# Patient Record
Sex: Female | Born: 1950 | Race: White | Hispanic: No | Marital: Married | Smoking: Never smoker
Health system: Southern US, Community
[De-identification: ages and names within clinical notes are randomized; demographics above are authoritative.]

## PROBLEM LIST (undated history)

## (undated) DIAGNOSIS — H269 Unspecified cataract: Secondary | ICD-10-CM

## (undated) DIAGNOSIS — R112 Nausea with vomiting, unspecified: Secondary | ICD-10-CM

## (undated) DIAGNOSIS — M810 Age-related osteoporosis without current pathological fracture: Secondary | ICD-10-CM

## (undated) DIAGNOSIS — K219 Gastro-esophageal reflux disease without esophagitis: Secondary | ICD-10-CM

## (undated) DIAGNOSIS — Z9889 Other specified postprocedural states: Secondary | ICD-10-CM

## (undated) DIAGNOSIS — E785 Hyperlipidemia, unspecified: Secondary | ICD-10-CM

## (undated) HISTORY — PX: HERNIA REPAIR: SHX51

## (undated) HISTORY — PX: MOUTH SURGERY: SHX715

## (undated) HISTORY — DX: Gastro-esophageal reflux disease without esophagitis: K21.9

## (undated) HISTORY — DX: Unspecified cataract: H26.9

## (undated) HISTORY — PX: EYE SURGERY: SHX253

## (undated) HISTORY — DX: Hyperlipidemia, unspecified: E78.5

## (undated) HISTORY — PX: RETINAL LASER PROCEDURE: SHX2339

---

## 2003-11-03 HISTORY — PX: COLONOSCOPY: SHX174

## 2003-12-24 LAB — HM COLONOSCOPY

## 2004-10-28 ENCOUNTER — Ambulatory Visit: Payer: Self-pay | Admitting: Family Medicine

## 2005-11-12 ENCOUNTER — Other Ambulatory Visit: Payer: Self-pay

## 2005-11-12 ENCOUNTER — Observation Stay: Payer: Self-pay | Admitting: Internal Medicine

## 2005-12-25 ENCOUNTER — Ambulatory Visit: Payer: Self-pay | Admitting: Family Medicine

## 2006-12-28 ENCOUNTER — Ambulatory Visit: Payer: Self-pay | Admitting: Family Medicine

## 2008-01-05 ENCOUNTER — Ambulatory Visit: Payer: Self-pay | Admitting: Family Medicine

## 2009-01-08 ENCOUNTER — Ambulatory Visit: Payer: Self-pay | Admitting: Family Medicine

## 2010-12-31 LAB — HM DEXA SCAN

## 2011-01-07 ENCOUNTER — Ambulatory Visit: Payer: Self-pay

## 2011-01-08 ENCOUNTER — Ambulatory Visit: Payer: Self-pay | Admitting: Family Medicine

## 2011-01-14 ENCOUNTER — Emergency Department: Payer: Self-pay | Admitting: Emergency Medicine

## 2011-01-21 ENCOUNTER — Ambulatory Visit: Payer: Self-pay | Admitting: Family Medicine

## 2012-06-09 ENCOUNTER — Ambulatory Visit: Payer: Self-pay | Admitting: Family Medicine

## 2013-07-31 LAB — HM PAP SMEAR: HM Pap smear: NEGATIVE

## 2013-08-02 ENCOUNTER — Ambulatory Visit: Payer: Self-pay | Admitting: Family Medicine

## 2014-10-15 LAB — BASIC METABOLIC PANEL
BUN: 10 mg/dL (ref 4–21)
CREATININE: 0.6 mg/dL (ref 0.5–1.1)
Glucose: 97 mg/dL
POTASSIUM: 5 mmol/L (ref 3.4–5.3)
Sodium: 144 mmol/L (ref 137–147)

## 2014-10-15 LAB — CBC AND DIFFERENTIAL
HCT: 37 % (ref 36–46)
Hemoglobin: 12.4 g/dL (ref 12.0–16.0)
Platelets: 351 10*3/uL (ref 150–399)
WBC: 6.4 10^3/mL

## 2014-10-15 LAB — LIPID PANEL
Cholesterol: 226 mg/dL — AB (ref 0–200)
HDL: 58 mg/dL (ref 35–70)
LDL CALC: 143 mg/dL
TRIGLYCERIDES: 123 mg/dL (ref 40–160)

## 2014-10-15 LAB — HEMOGLOBIN A1C: Hemoglobin A1C: 5.8

## 2014-10-15 LAB — TSH: TSH: 1.41 u[IU]/mL (ref 0.41–5.90)

## 2014-10-15 LAB — HEPATIC FUNCTION PANEL
ALT: 10 U/L (ref 7–35)
AST: 15 U/L (ref 13–35)

## 2014-11-12 ENCOUNTER — Ambulatory Visit: Payer: Self-pay | Admitting: Family Medicine

## 2014-11-12 LAB — HM MAMMOGRAPHY

## 2015-06-11 DIAGNOSIS — S233XXA Sprain of ligaments of thoracic spine, initial encounter: Secondary | ICD-10-CM | POA: Diagnosis not present

## 2015-06-11 DIAGNOSIS — S139XXA Sprain of joints and ligaments of unspecified parts of neck, initial encounter: Secondary | ICD-10-CM | POA: Diagnosis not present

## 2015-06-17 DIAGNOSIS — M546 Pain in thoracic spine: Secondary | ICD-10-CM | POA: Diagnosis not present

## 2015-06-17 DIAGNOSIS — M542 Cervicalgia: Secondary | ICD-10-CM | POA: Diagnosis not present

## 2015-06-21 DIAGNOSIS — M542 Cervicalgia: Secondary | ICD-10-CM | POA: Diagnosis not present

## 2015-06-21 DIAGNOSIS — M546 Pain in thoracic spine: Secondary | ICD-10-CM | POA: Diagnosis not present

## 2015-06-25 DIAGNOSIS — M546 Pain in thoracic spine: Secondary | ICD-10-CM | POA: Diagnosis not present

## 2015-06-25 DIAGNOSIS — M542 Cervicalgia: Secondary | ICD-10-CM | POA: Diagnosis not present

## 2015-06-27 DIAGNOSIS — M542 Cervicalgia: Secondary | ICD-10-CM | POA: Diagnosis not present

## 2015-06-27 DIAGNOSIS — M546 Pain in thoracic spine: Secondary | ICD-10-CM | POA: Diagnosis not present

## 2015-07-01 DIAGNOSIS — M546 Pain in thoracic spine: Secondary | ICD-10-CM | POA: Diagnosis not present

## 2015-07-01 DIAGNOSIS — M542 Cervicalgia: Secondary | ICD-10-CM | POA: Diagnosis not present

## 2015-07-04 DIAGNOSIS — M546 Pain in thoracic spine: Secondary | ICD-10-CM | POA: Diagnosis not present

## 2015-07-04 DIAGNOSIS — M542 Cervicalgia: Secondary | ICD-10-CM | POA: Diagnosis not present

## 2015-07-11 DIAGNOSIS — M546 Pain in thoracic spine: Secondary | ICD-10-CM | POA: Diagnosis not present

## 2015-07-11 DIAGNOSIS — M542 Cervicalgia: Secondary | ICD-10-CM | POA: Diagnosis not present

## 2015-07-18 DIAGNOSIS — M546 Pain in thoracic spine: Secondary | ICD-10-CM | POA: Diagnosis not present

## 2015-07-18 DIAGNOSIS — M542 Cervicalgia: Secondary | ICD-10-CM | POA: Diagnosis not present

## 2015-09-04 DIAGNOSIS — Z23 Encounter for immunization: Secondary | ICD-10-CM | POA: Diagnosis not present

## 2015-09-09 DIAGNOSIS — M546 Pain in thoracic spine: Secondary | ICD-10-CM | POA: Diagnosis not present

## 2015-11-13 DIAGNOSIS — H2513 Age-related nuclear cataract, bilateral: Secondary | ICD-10-CM | POA: Diagnosis not present

## 2015-11-21 DIAGNOSIS — J309 Allergic rhinitis, unspecified: Secondary | ICD-10-CM | POA: Insufficient documentation

## 2015-11-21 DIAGNOSIS — M8580 Other specified disorders of bone density and structure, unspecified site: Secondary | ICD-10-CM

## 2015-11-21 DIAGNOSIS — R739 Hyperglycemia, unspecified: Secondary | ICD-10-CM | POA: Insufficient documentation

## 2015-11-21 DIAGNOSIS — R5383 Other fatigue: Secondary | ICD-10-CM | POA: Insufficient documentation

## 2015-11-21 DIAGNOSIS — R079 Chest pain, unspecified: Secondary | ICD-10-CM | POA: Insufficient documentation

## 2015-11-21 DIAGNOSIS — R202 Paresthesia of skin: Secondary | ICD-10-CM | POA: Insufficient documentation

## 2015-11-21 DIAGNOSIS — R7303 Prediabetes: Secondary | ICD-10-CM | POA: Insufficient documentation

## 2015-11-21 DIAGNOSIS — M549 Dorsalgia, unspecified: Secondary | ICD-10-CM | POA: Insufficient documentation

## 2015-11-21 DIAGNOSIS — M810 Age-related osteoporosis without current pathological fracture: Secondary | ICD-10-CM | POA: Insufficient documentation

## 2015-11-21 DIAGNOSIS — F419 Anxiety disorder, unspecified: Secondary | ICD-10-CM | POA: Insufficient documentation

## 2015-11-22 ENCOUNTER — Encounter: Payer: Self-pay | Admitting: Family Medicine

## 2015-11-22 ENCOUNTER — Ambulatory Visit (INDEPENDENT_AMBULATORY_CARE_PROVIDER_SITE_OTHER): Payer: Medicare Other | Admitting: Family Medicine

## 2015-11-22 VITALS — BP 122/80 | HR 90 | Temp 98.1°F | Resp 16 | Ht 63.5 in | Wt 151.0 lb

## 2015-11-22 DIAGNOSIS — Z1239 Encounter for other screening for malignant neoplasm of breast: Secondary | ICD-10-CM

## 2015-11-22 DIAGNOSIS — Z0000 Encounter for general adult medical examination without abnormal findings: Secondary | ICD-10-CM | POA: Diagnosis not present

## 2015-11-22 DIAGNOSIS — Z23 Encounter for immunization: Secondary | ICD-10-CM

## 2015-11-22 DIAGNOSIS — Z780 Asymptomatic menopausal state: Secondary | ICD-10-CM | POA: Diagnosis not present

## 2015-11-22 DIAGNOSIS — E7800 Pure hypercholesterolemia, unspecified: Secondary | ICD-10-CM | POA: Diagnosis not present

## 2015-11-22 NOTE — Progress Notes (Signed)
Patient ID: Caitlyn Baker, female   DOB: [birth year], 65 y.o.   MRN: [id number]       Patient: Caitlyn Baker, Female    DOB: [birth year], 65 y.o.   MRN: [id number] Visit Date: 11/22/2015  Today's Provider: Margarita Rana, MD   Chief Complaint  Patient presents with  . Medicare Wellness   Subjective:    Annual wellness visit Caitlyn Baker is a 65 y.o. female. She feels well. She reports exercising 2 times a week (walking), stays active with daily activities. She reports she is sleeping well.  10/12/14 CPE 07/31/13 Pap-neg 11/12/14 Mammo-BI-RADS 1 12/24/03 Colon-Int hemorrhoids 12/31/10 BMD-mild osteopenia  Lab Results  Component Value Date   WBC 6.4 10/15/2014   HGB 12.4 10/15/2014   HCT 37 10/15/2014   PLT 351 10/15/2014   CHOL 226* 10/15/2014   TRIG 123 10/15/2014   HDL 58 10/15/2014   LDLCALC 143 10/15/2014   ALT 10 10/15/2014   AST 15 10/15/2014   NA 144 10/15/2014   K 5.0 10/15/2014   CREATININE 0.6 10/15/2014   BUN 10 10/15/2014   TSH 1.41 10/15/2014   HGBA1C 5.8 10/15/2014    -----------------------------------------------------------   Review of Systems  Constitutional: Negative.   HENT: Negative.   Eyes: Negative.   Respiratory: Negative.   Cardiovascular: Negative.   Gastrointestinal: Negative.   Endocrine: Negative.   Genitourinary: Negative.   Musculoskeletal: Positive for arthralgias.  Allergic/Immunologic: Negative.   Neurological: Negative.   Hematological: Negative.   Psychiatric/Behavioral: Negative.     Social History   Social History  . Marital Status: Married    Spouse Name: N/A  . Number of Children: N/A  . Years of Education: N/A   Occupational History  . Not on file.   Social History Main Topics  . Smoking status: Never Smoker   . Smokeless tobacco: Never Used  . Alcohol Use: No  . Drug Use: No  . Sexual Activity: Not on file   Other Topics Concern  . Not on file   Social History Narrative    Past Medical History    Diagnosis Date  . Allergy   . Hyperlipidemia      Patient Active Problem List   Diagnosis Date Noted  . Allergic rhinitis 11/21/2015  . Anxiety 11/21/2015  . Back ache 11/21/2015  . Chest pain 11/21/2015  . Fatigue 11/21/2015  . Blood glucose elevated 11/21/2015  . Osteopenia 11/21/2015  . Hand paresthesia 11/21/2015  . Hypercholesteremia 01/13/2007    Past Surgical History  Procedure Laterality Date  . Mouth surgery      Her family history includes Hypertension in her mother.    Previous Medications   ASPIRIN 81 MG TABLET    Take 81 mg by mouth daily.   CALCIUM CARBONATE-VITAMIN D 600-400 MG-UNIT TABLET    Take by mouth.   MULTIPLE VITAMIN PO    Take by mouth.   OMEGA-3 FATTY ACIDS (FISH OIL DOUBLE STRENGTH) 1200 MG CAPS    Take by mouth.   SIMVASTATIN (ZOCOR) 20 MG TABLET    Take by mouth. Reported on 11/22/2015   VITAMIN D, CHOLECALCIFEROL, PO    Take by mouth.    Patient Care Team: Margarita Rana, MD as PCP - General (Family Medicine)     Objective:   Vitals: BP 122/80 mmHg  Pulse 90  Temp(Src) 98.1 F (36.7 C) (Oral)  Resp 16  Ht 5' 3.5" (1.613 m)  Wt 151 lb (68.493 kg)  BMI 26.33 kg/m2  SpO2 97%  Physical Exam  Constitutional: She is oriented to person, place, and time. She appears well-developed and well-nourished.  HENT:  Head: Normocephalic and atraumatic.  Right Ear: Tympanic membrane, external ear and ear canal normal.  Left Ear: Tympanic membrane, external ear and ear canal normal.  Nose: Nose normal.  Mouth/Throat: Uvula is midline, oropharynx is clear and moist and mucous membranes are normal.  Eyes: Conjunctivae, EOM and lids are normal. Pupils are equal, round, and reactive to light.  Neck: Trachea normal and normal range of motion. Neck supple. Carotid bruit is not present. No thyroid mass and no thyromegaly present.  Cardiovascular: Normal rate, regular rhythm and normal heart sounds.   Pulmonary/Chest: Effort normal and breath sounds  normal.  Abdominal: Soft. Normal appearance and bowel sounds are normal. There is no hepatosplenomegaly. There is no tenderness.  Genitourinary: No breast swelling, tenderness or discharge.  Musculoskeletal: Normal range of motion.  Lymphadenopathy:    She has no cervical adenopathy.    She has no axillary adenopathy.  Neurological: She is alert and oriented to person, place, and time. She has normal strength. No cranial nerve deficit.  Skin: Skin is warm, dry and intact.  Psychiatric: She has a normal mood and affect. Her speech is normal and behavior is normal. Judgment and thought content normal. Cognition and memory are normal.    Activities of Daily Living In your present state of health, do you have any difficulty performing the following activities: 11/22/2015  Hearing? N  Vision? N  Difficulty concentrating or making decisions? N  Walking or climbing stairs? N  Dressing or bathing? N  Doing errands, shopping? N    Fall Risk Assessment Fall Risk  11/22/2015  Falls in the past year? No     Depression Screen PHQ 2/9 Scores 11/22/2015  PHQ - 2 Score 0    Cognitive Testing - 6-CIT  Correct? Score   What year is it? yes 0 0 or 4  What month is it? yes 0 0 or 3  Memorize:    Caitlyn Baker,  41,  [address]  [place],      What time is it? (within 1 hour) yes 0 0 or 3  Count backwards from 20 yes 0 0, 2, or 4  Name the months of the year yes 0 0, 2, or 4  Repeat name & address above yes 0 0, 2, 4, 6, 8, or 10       TOTAL SCORE  0/28   Interpretation:  Normal  Normal (0-7) Abnormal (8-28)       Assessment & Plan:     Annual Wellness Visit  Reviewed patient's Family Medical History Reviewed and updated list of patient's medical providers Assessment of cognitive impairment was done Assessed patient's functional ability Established a written schedule for health screening [place] Completed and Reviewed  Exercise Activities and Dietary  recommendations Goals    None      Immunization History  Administered Date(s) Administered  . Influenza-Unspecified 09/04/2015  . Pneumococcal Conjugate-13 11/22/2015  . Td 08/06/2004, 10/12/2014  . Tdap 10/12/2014     1. Initial Medicare annual wellness visit Stable. Patient advised to continue eating healthy and exercise daily. - EKG 12-Lead  2. Need for pneumococcal vaccination - Pneumococcal conjugate vaccine 13-valent IM  3. Breast cancer screening - MM Digital Diagnostic Bilat; Future  4. Hypercholesteremia F/U pending lab report. Patient has been off of simvastatin for a few months. - Lipid Panel  With LDL/HDL Ratio  5. Postmenopausal - DG Bone Density; Future     Patient seen and examined by Dr. Jerrell Belfast, and note scribed by Philbert Riser. Dimas, CMA. I have reviewed the document for accuracy and completeness and I agree with above. Jerrell Belfast, MD   Margarita Rana, MD    ------------------------------------------------------------------------------------------------------------

## 2015-11-22 NOTE — Patient Instructions (Signed)
Please call the [facility] at [facility] to schedule this at [phone]   

## 2015-11-28 ENCOUNTER — Other Ambulatory Visit: Payer: Self-pay | Admitting: Family Medicine

## 2015-11-28 DIAGNOSIS — E7800 Pure hypercholesterolemia, unspecified: Secondary | ICD-10-CM | POA: Diagnosis not present

## 2015-11-28 DIAGNOSIS — Z1231 Encounter for screening mammogram for malignant neoplasm of breast: Secondary | ICD-10-CM

## 2015-11-29 LAB — LIPID PANEL WITH LDL/HDL RATIO
CHOLESTEROL TOTAL: 245 mg/dL — AB (ref 100–199)
HDL: 61 mg/dL (ref >39)
LDL CALC: 154 mg/dL — AB (ref 0–99)
LDl/HDL Ratio: 2.5 ratio units (ref 0.0–3.2)
TRIGLYCERIDES: 150 mg/dL — AB (ref 0–149)
VLDL CHOLESTEROL CAL: 30 mg/dL (ref 5–40)

## 2015-12-02 ENCOUNTER — Other Ambulatory Visit: Payer: Self-pay

## 2015-12-02 DIAGNOSIS — E7800 Pure hypercholesterolemia, unspecified: Secondary | ICD-10-CM

## 2015-12-02 MED ORDER — SIMVASTATIN 20 MG PO TABS: 20.0000 mg | ORAL_TABLET | Freq: Every day | ORAL | Status: DC

## 2015-12-02 NOTE — Telephone Encounter (Signed)
-----   Message from Margarita Rana, MD sent at 11/29/2015  4:02 PM EST ----- Ok to send in refill. Thanks.

## 2015-12-12 ENCOUNTER — Telehealth: Payer: Self-pay

## 2015-12-12 ENCOUNTER — Ambulatory Visit
Admission: RE | Admit: 2015-12-12 | Discharge: 2015-12-12 | Disposition: A | Discharge status: Home / Self Care | Payer: Medicare Other | Source: Ambulatory Visit | Attending: Family Medicine | Admitting: Family Medicine

## 2015-12-12 ENCOUNTER — Other Ambulatory Visit: Payer: Self-pay | Admitting: Family Medicine

## 2015-12-12 DIAGNOSIS — M8588 Other specified disorders of bone density and structure, other site: Secondary | ICD-10-CM | POA: Diagnosis not present

## 2015-12-12 DIAGNOSIS — Z1231 Encounter for screening mammogram for malignant neoplasm of breast: Secondary | ICD-10-CM | POA: Insufficient documentation

## 2015-12-12 DIAGNOSIS — Z780 Asymptomatic menopausal state: Secondary | ICD-10-CM | POA: Diagnosis not present

## 2015-12-12 DIAGNOSIS — Z13820 Encounter for screening for osteoporosis: Secondary | ICD-10-CM | POA: Diagnosis not present

## 2015-12-12 DIAGNOSIS — M85852 Other specified disorders of bone density and structure, left thigh: Secondary | ICD-10-CM | POA: Diagnosis not present

## 2015-12-12 DIAGNOSIS — M8589 Other specified disorders of bone density and structure, multiple sites: Secondary | ICD-10-CM | POA: Diagnosis not present

## 2015-12-12 LAB — HM MAMMOGRAPHY

## 2015-12-12 NOTE — Telephone Encounter (Signed)
Informed pt as below. Pt verbalizes understanding. Jamonica Schoff Drozdowski, CMA  

## 2015-12-12 NOTE — Telephone Encounter (Signed)
-----   Message from Margarita Rana, MD sent at 12/12/2015  2:42 PM EST ----- Bone density with osteopenia. Not osteoporosis. Recheck in   2 years. Thanks.

## 2016-05-08 ENCOUNTER — Other Ambulatory Visit: Payer: Self-pay | Admitting: *Deleted

## 2016-05-08 MED ORDER — SIMVASTATIN 20 MG PO TABS: 20.0000 mg | ORAL_TABLET | Freq: Every day | ORAL | Status: DC

## 2016-05-08 NOTE — Telephone Encounter (Signed)
Patient has ov appt with Dr. Caryn Section 05/21/2016.

## 2016-05-21 ENCOUNTER — Encounter: Payer: Self-pay | Admitting: Family Medicine

## 2016-05-21 ENCOUNTER — Ambulatory Visit (INDEPENDENT_AMBULATORY_CARE_PROVIDER_SITE_OTHER): Payer: Medicare Other | Admitting: Family Medicine

## 2016-05-21 ENCOUNTER — Ambulatory Visit: Payer: Medicare Other | Admitting: Family Medicine

## 2016-05-21 VITALS — BP 140/80 | HR 97 | Temp 98.2°F | Resp 16 | Wt 133.0 lb

## 2016-05-21 DIAGNOSIS — Z1211 Encounter for screening for malignant neoplasm of colon: Secondary | ICD-10-CM

## 2016-05-21 DIAGNOSIS — E7800 Pure hypercholesterolemia, unspecified: Secondary | ICD-10-CM | POA: Diagnosis not present

## 2016-05-21 NOTE — Progress Notes (Signed)
Patient: Caitlyn Baker Female    DOB: [birth year]   65 y.o.   MRN: [id number] Visit Date: 05/21/2016  Today's Provider: Lelon Huh, MD   Chief Complaint  Patient presents with  . Follow-up  . Hyperlipidemia   Subjective:    HPI  This is a previous patient of Dr. Venia Minks present today as new patient to me to establish care and follow up on chronic medical problems.     Lipid/Cholesterol, Follow-up:   Last seen for this 6 months ago.  Management since that visit includes; labs checked-showed cholesterol still elevated at 245. Patient was not taking simvastatin as prescribed. Patient given refill and advised to recheck labs.   Last Lipid Panel:    Component Value Date/Time   CHOL 245* 11/28/2015 0814   CHOL 226* 10/15/2014   TRIG 150* 11/28/2015 0814   HDL 61 11/28/2015 0814   HDL 58 10/15/2014   LDLCALC 154* 11/28/2015 0814   LDLCALC 143 10/15/2014    She reports non-compliance with treatment due to side effects of medications which include extreme fatigue and weakness in her legs. She has since gone on low saturated fat and low sugar diet and feels really good  Her mother did die of heart attack in her mid 98s and she has one brother that has heart disease.    Wt Readings from Last 3 Encounters:  05/21/16 133 lb (60.328 kg)  11/22/15 151 lb (68.493 kg)  10/12/14 148 lb (67.132 kg)    ----------------------------------------------------------------------  Patient stated that she started back on Simvastatin, however because of leg pain she stopped taking it. Patient has not been on cholesterol medication since January.     Allergies  Allergen Reactions  . Codeine   . Prednisone     Other reaction(s): Chest pain Severe anxiety and depression.   Current Meds  Medication Sig  . aspirin 81 MG tablet Take 81 mg by mouth daily.  . Calcium Carbonate-Vitamin D 600-400 MG-UNIT tablet Take by mouth.  . MULTIPLE VITAMIN PO Take by mouth.  . Omega-3 Fatty  Acids (FISH OIL DOUBLE STRENGTH) 1200 MG CAPS Take by mouth.    Review of Systems  Constitutional: Negative for fever, chills, appetite change and fatigue.  Respiratory: Negative for chest tightness and shortness of breath.   Cardiovascular: Negative for chest pain and palpitations.  Gastrointestinal: Negative for nausea, vomiting and abdominal pain.  Neurological: Negative for dizziness and weakness.    Social History  Substance Use Topics  . Smoking status: Never Smoker   . Smokeless tobacco: Never Used  . Alcohol Use: No   Objective:   BP 140/80 mmHg  Pulse 97  Temp(Src) 98.2 F (36.8 C) (Oral)  Resp 16  Wt 133 lb (60.328 kg)  SpO2 97%  Physical Exam  General appearance: alert, well developed, well nourished, cooperative and in no distress Head: Normocephalic, without obvious abnormality, atraumatic Respiratory: Respirations even and unlabored, normal respiratory rate Extremities: No gross deformities Skin: Skin color, texture, turgor normal. No rashes seen  Psych: Appropriate mood and affect. Neurologic: Mental status: Alert, oriented to person, place, and time, thought content appropriate.     Assessment & Plan:     1. Hypercholesteremia Intolerant to simvastatin. On low saturated fat diet. Consider trial of CoQ10 with a different statin if LDL remains very high.  - Lipid panel - ALT  2. Colon cancer screening She had to cancel colonoscopy last year to emergency, but would rather have stool  test.  - Cologuard  The entirety of the information documented in the History of Present Illness, Review of Systems and Physical Exam were personally obtained by me. Portions of this information were initially documented by April M. Sabra Heck, CMA and reviewed by me for thoroughness and accuracy.        Lelon Huh, MD  [facility]

## 2016-05-22 DIAGNOSIS — E7800 Pure hypercholesterolemia, unspecified: Secondary | ICD-10-CM | POA: Diagnosis not present

## 2016-05-23 LAB — LIPID PANEL
CHOLESTEROL TOTAL: 244 mg/dL — AB (ref 100–199)
Chol/HDL Ratio: 3.9 ratio units (ref 0.0–4.4)
HDL: 63 mg/dL (ref >39)
LDL Calculated: 147 mg/dL — ABNORMAL HIGH (ref 0–99)
Triglycerides: 168 mg/dL — ABNORMAL HIGH (ref 0–149)
VLDL CHOLESTEROL CAL: 34 mg/dL (ref 5–40)

## 2016-05-23 LAB — ALT: ALT: 11 IU/L (ref 0–32)

## 2016-05-26 ENCOUNTER — Telehealth: Payer: Self-pay

## 2016-05-26 DIAGNOSIS — E7800 Pure hypercholesterolemia, unspecified: Secondary | ICD-10-CM

## 2016-05-26 MED ORDER — PRAVASTATIN SODIUM 20 MG PO TABS: 20.0000 mg | ORAL_TABLET | Freq: Every day | ORAL | 3 refills | Status: DC

## 2016-05-26 NOTE — Telephone Encounter (Signed)
-----   Message from Birdie Sons, MD sent at 05/24/2016  8:55 AM EDT ----- Cholesterol is still too high. Recommend trial of pravastatin 20mg  daily, #30, rf x 3 and OTC Coenzyme Q10 200mg  daily. Follow up in 3 months.

## 2016-05-26 NOTE — Telephone Encounter (Signed)
Patient advised of lab results and agrees to start medication. Prescription for Pravastatin sent into pharmacy.Follow up appointment scheduled 08/27/2016 at 8:15am.

## 2016-08-03 DIAGNOSIS — H43812 Vitreous degeneration, left eye: Secondary | ICD-10-CM | POA: Diagnosis not present

## 2016-08-07 DIAGNOSIS — Z23 Encounter for immunization: Secondary | ICD-10-CM | POA: Diagnosis not present

## 2016-08-10 DIAGNOSIS — H43811 Vitreous degeneration, right eye: Secondary | ICD-10-CM | POA: Diagnosis not present

## 2016-08-18 IMAGING — MG MM DIGITAL SCREENING BILAT W/ TOMO W/ CAD
9 of 13 series · 9 of 29 positions shown · non-contrast
Comparison: Previous exam(s).

CLINICAL DATA: Screening.

EXAM:
DIGITAL SCREENING BILATERAL MAMMOGRAM WITH 3D TOMO WITH CAD

[R MLO (1 of 2)]
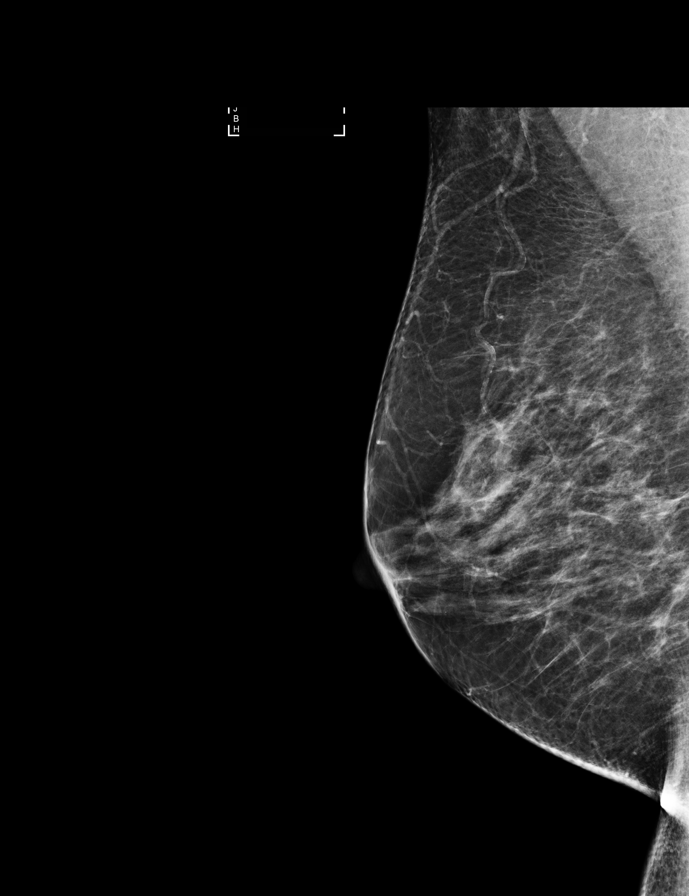

[R MLO synth-2D]
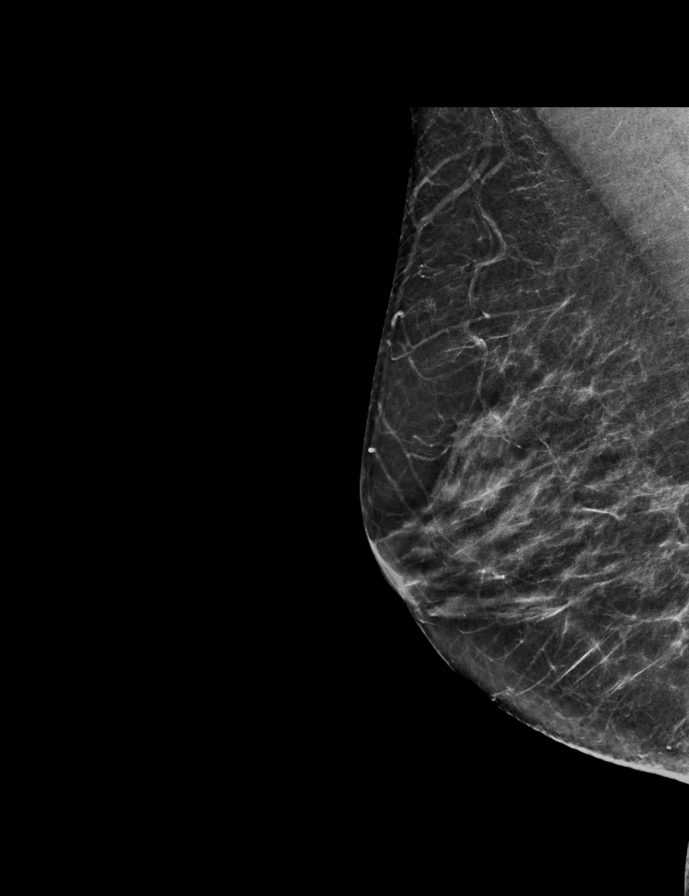

[L MLO]
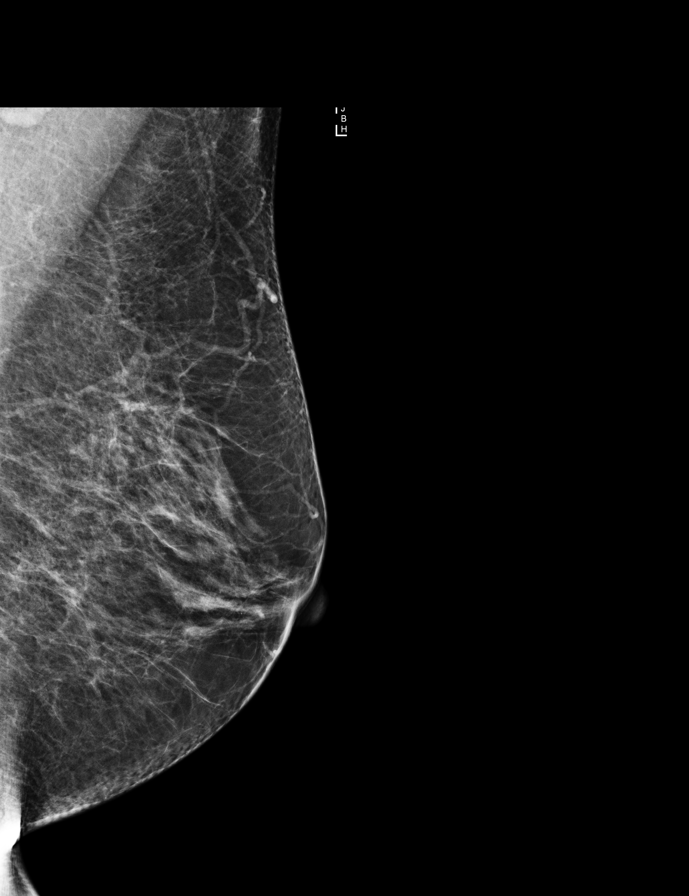

[L CC synth-2D]
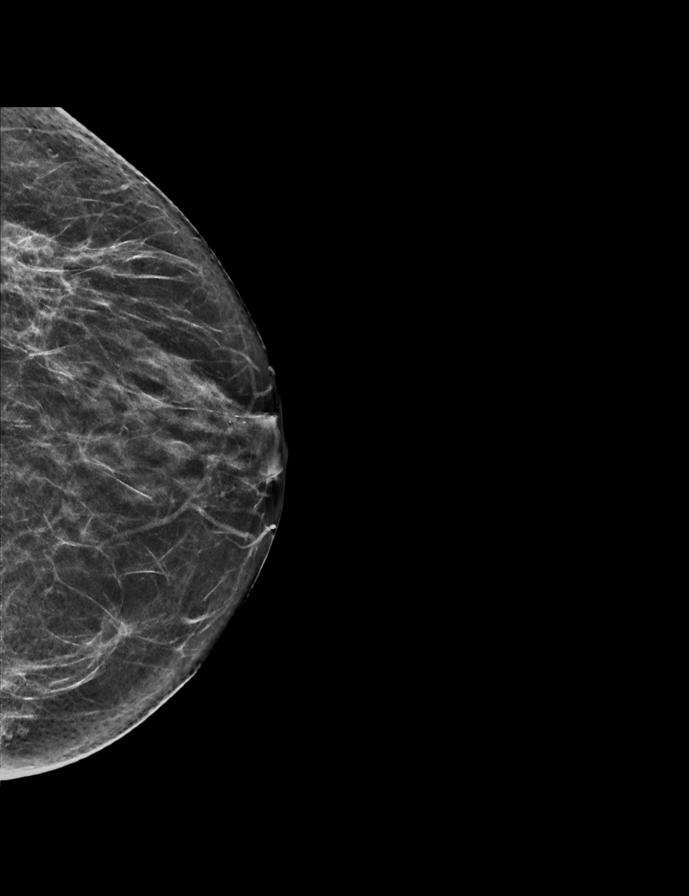

[L MLO synth-2D]
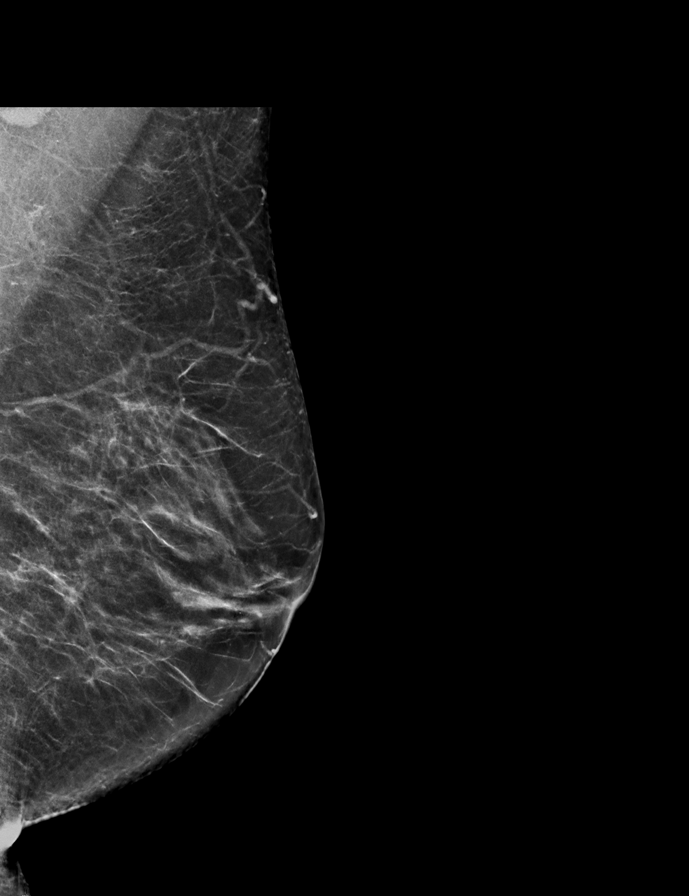

[R MLO (2 of 2)]
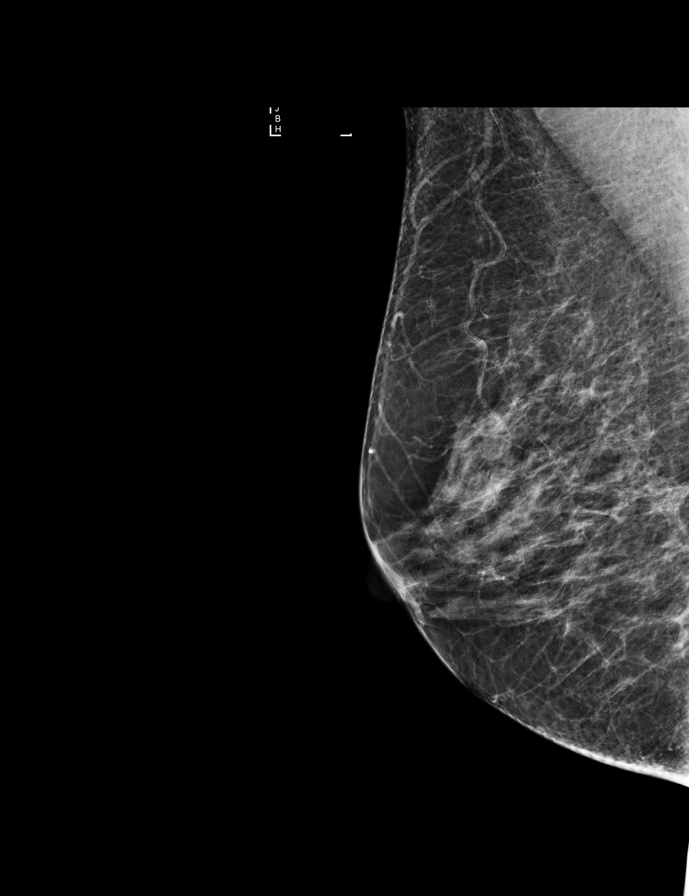

[L CC]
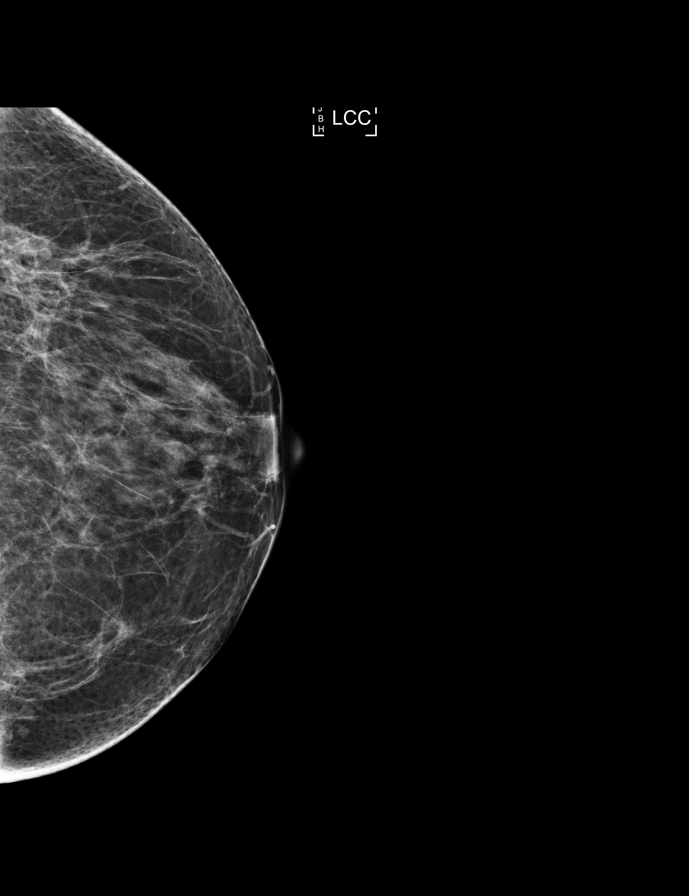

[R CC]
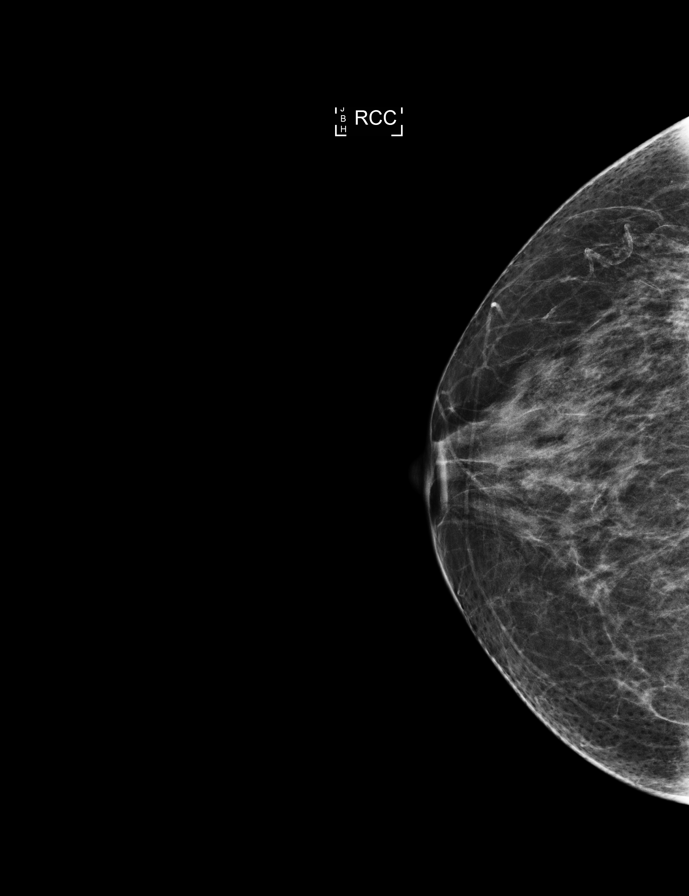

[R CC synth-2D]
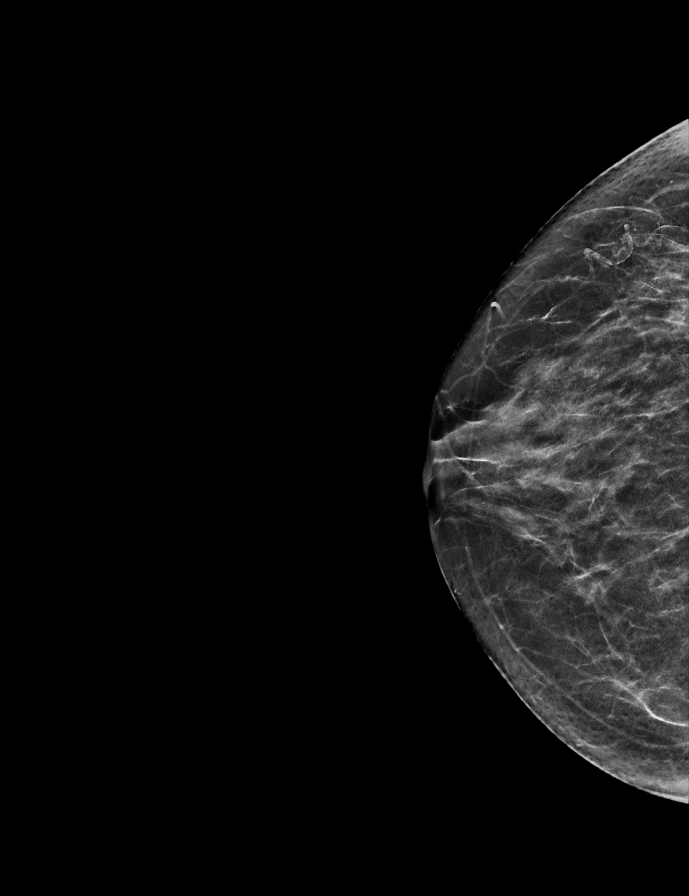

[9 of 29 positions shown; findings below may reference images not displayed]

ACR Breast Density Category c: The breast tissue is heterogeneously
dense, which may obscure small masses.
FINDINGS: There are no findings suspicious for malignancy. Images were
processed with CAD.
IMPRESSION: No mammographic evidence of malignancy. A result letter of this
screening mammogram will be mailed directly to the patient.

RECOMMENDATION:
Screening mammogram in one year. (Code:[id number]-[id number])

BI-RADS CATEGORY  1: Negative.

## 2016-08-27 ENCOUNTER — Ambulatory Visit (INDEPENDENT_AMBULATORY_CARE_PROVIDER_SITE_OTHER): Payer: Medicare Other | Admitting: Family Medicine

## 2016-08-27 ENCOUNTER — Encounter: Payer: Self-pay | Admitting: Family Medicine

## 2016-08-27 VITALS — BP 122/72 | HR 68 | Temp 97.8°F | Resp 16 | Wt 134.0 lb

## 2016-08-27 DIAGNOSIS — E7800 Pure hypercholesterolemia, unspecified: Secondary | ICD-10-CM

## 2016-08-27 NOTE — Progress Notes (Signed)
       Patient: Caitlyn Baker Female    DOB: [birth year]   65 y.o.   MRN: [id number] Visit Date: 08/27/2016  Today's Provider: Lelon Huh, MD   Chief Complaint  Patient presents with  . Hyperlipidemia   Subjective:    HPI  Lipid/Cholesterol, Follow-up:   Last seen for this3 months ago.  Management changes since that visit include starting Pravastatin 20mg  and CoQ10 once daily. . Last Lipid Panel:    Component Value Date/Time   CHOL 244 (H) 05/22/2016 0816   TRIG 168 (H) 05/22/2016 0816   HDL 63 05/22/2016 0816   CHOLHDL 3.9 05/22/2016 0816   LDLCALC 147 (H) 05/22/2016 0816    She reports good compliance with treatment. She is not having side effects.  Current symptoms include none and have been stable. Weight trend: stable Prior visit with dietician: no Current diet: well balanced Current exercise: walking  Wt Readings from Last 3 Encounters:  08/27/16 134 lb (60.8 kg)  05/21/16 133 lb (60.3 kg)  11/22/15 151 lb (68.5 kg)         Allergies  Allergen Reactions  . Codeine   . Prednisone     Other reaction(s): Chest pain Severe anxiety and depression.     Current Outpatient Prescriptions:  .  aspirin 81 MG tablet, Take 81 mg by mouth daily., Disp: , Rfl:  .  Calcium Carbonate-Vitamin D 600-400 MG-UNIT tablet, Take by mouth., Disp: , Rfl:  .  MULTIPLE VITAMIN PO, Take by mouth., Disp: , Rfl:  .  Omega-3 Fatty Acids (FISH OIL DOUBLE STRENGTH) 1200 MG CAPS, Take by mouth., Disp: , Rfl:  .  pravastatin (PRAVACHOL) 20 MG tablet, Take 1 tablet (20 mg total) by mouth daily., Disp: 30 tablet, Rfl: 3  Review of Systems  Constitutional: Negative.   Respiratory: Negative.   Cardiovascular: Negative.   Gastrointestinal: Negative.   Musculoskeletal: Negative.   Neurological: Negative.     Social History  Substance Use Topics  . Smoking status: Never Smoker  . Smokeless tobacco: Never Used  . Alcohol use No   Objective:   BP 122/72 (BP Location: Left  Arm, Patient Position: Sitting, Cuff Size: Normal)   Pulse 68   Temp 97.8 F (36.6 C)   Resp 16   Wt 134 lb (60.8 kg)   BMI 23.36 kg/m   Physical Exam  General appearance: alert, well developed, well nourished, cooperative and in no distress Head: Normocephalic, without obvious abnormality, atraumatic Respiratory: Respirations even and unlabored, normal respiratory rate Extremities: No gross deformities Skin: Skin color, texture, turgor normal. No rashes seen  Psych: Appropriate mood and affect. Neurologic: Mental status: Alert, oriented to person, place, and time, thought content appropriate.     Assessment & Plan:     1. Hypercholesterolemia She is tolerating initiation of pravastatin with CoQ10 well with no adverse effects.   - Lipid panel - Hepatic function panel  She states she had flu vaccine at     The entirety of the information documented in the History of Present Illness, Review of Systems and Physical Exam were personally obtained by me. Portions of this information were initially documented by Wilburt Finlay, CMA and reviewed by me for thoroughness and accuracy.    Lelon Huh, MD  [facility]

## 2016-08-28 ENCOUNTER — Telehealth: Payer: Self-pay

## 2016-08-28 DIAGNOSIS — E7800 Pure hypercholesterolemia, unspecified: Secondary | ICD-10-CM

## 2016-08-28 LAB — LIPID PANEL
CHOL/HDL RATIO: 3 ratio (ref 0.0–4.4)
Cholesterol, Total: 200 mg/dL — ABNORMAL HIGH (ref 100–199)
HDL: 66 mg/dL (ref >39)
LDL CALC: 113 mg/dL — AB (ref 0–99)
Triglycerides: 105 mg/dL (ref 0–149)
VLDL CHOLESTEROL CAL: 21 mg/dL (ref 5–40)

## 2016-08-28 LAB — HEPATIC FUNCTION PANEL
ALT: 13 IU/L (ref 0–32)
AST: 21 IU/L (ref 0–40)
Albumin: 4.4 g/dL (ref 3.6–4.8)
Alkaline Phosphatase: 68 IU/L (ref 39–117)
Bilirubin Total: 0.5 mg/dL (ref 0.0–1.2)
Bilirubin, Direct: 0.13 mg/dL (ref 0.00–0.40)
Total Protein: 7 g/dL (ref 6.0–8.5)

## 2016-08-28 MED ORDER — PRAVASTATIN SODIUM 20 MG PO TABS: 20.0000 mg | ORAL_TABLET | Freq: Every day | ORAL | 11 refills | Status: DC

## 2016-08-28 NOTE — Telephone Encounter (Signed)
Patient advised and verbally voiced understanding. Prescription sent into the pharmacy.  

## 2016-08-28 NOTE — Telephone Encounter (Signed)
-----   Message from Birdie Sons, MD sent at 08/28/2016  7:53 AM EDT ----- Cholesterol significantly better, down from 244 to 200. Would like to see around 180. Continue current dose of pravastatin for now and avoid saturated fats (mainly red meats) can send 1 years refill to her pharmacy.

## 2016-09-18 DIAGNOSIS — H43812 Vitreous degeneration, left eye: Secondary | ICD-10-CM | POA: Diagnosis not present

## 2017-01-25 DIAGNOSIS — H43812 Vitreous degeneration, left eye: Secondary | ICD-10-CM | POA: Diagnosis not present

## 2017-03-03 ENCOUNTER — Encounter: Payer: Self-pay | Admitting: Family Medicine

## 2017-03-03 ENCOUNTER — Ambulatory Visit (INDEPENDENT_AMBULATORY_CARE_PROVIDER_SITE_OTHER): Payer: Medicare Other | Admitting: Family Medicine

## 2017-03-03 VITALS — BP 120/70 | HR 88 | Temp 98.2°F | Resp 16 | Ht 64.0 in | Wt 141.0 lb

## 2017-03-03 DIAGNOSIS — E7800 Pure hypercholesterolemia, unspecified: Secondary | ICD-10-CM | POA: Diagnosis not present

## 2017-03-03 DIAGNOSIS — Z1211 Encounter for screening for malignant neoplasm of colon: Secondary | ICD-10-CM

## 2017-03-03 DIAGNOSIS — M791 Myalgia, unspecified site: Secondary | ICD-10-CM

## 2017-03-03 DIAGNOSIS — M8580 Other specified disorders of bone density and structure, unspecified site: Secondary | ICD-10-CM

## 2017-03-03 DIAGNOSIS — Z0000 Encounter for general adult medical examination without abnormal findings: Secondary | ICD-10-CM | POA: Diagnosis not present

## 2017-03-03 DIAGNOSIS — Z23 Encounter for immunization: Secondary | ICD-10-CM | POA: Diagnosis not present

## 2017-03-03 DIAGNOSIS — R5383 Other fatigue: Secondary | ICD-10-CM | POA: Diagnosis not present

## 2017-03-03 NOTE — Patient Instructions (Signed)
Preventive Care 67 Years and Older, Female Preventive care refers to lifestyle choices and visits with your health care provider that can promote health and wellness. What does preventive care include?  A yearly physical exam. This is also called an annual well check.  Dental exams once or twice a year.  Routine eye exams. Ask your health care provider how often you should have your eyes checked.  Personal lifestyle choices, including:  Daily care of your teeth and gums.  Regular physical activity.  Eating a healthy diet.  Avoiding tobacco and drug use.  Limiting alcohol use.  Practicing safe sex.  Taking low-dose aspirin every day.  Taking vitamin and mineral supplements as recommended by your health care provider. What happens during an annual well check? The services and screenings done by your health care provider during your annual well check will depend on your age, overall health, lifestyle risk factors, and family history of disease. Counseling  Your health care provider may ask you questions about your:  Alcohol use.  Tobacco use.  Drug use.  Emotional well-being.  Home and relationship well-being.  Sexual activity.  Eating habits.  History of falls.  Memory and ability to understand (cognition).  Work and work environment.  Reproductive health. Screening  You may have the following tests or measurements:  Height, weight, and BMI.  Blood pressure.  Lipid and cholesterol levels. These may be checked every 5 years, or more frequently if you are over 50 years old.  Skin check.  Lung cancer screening. You may have this screening every year starting at age 55 if you have a 30-pack-year history of smoking and currently smoke or have quit within the past 15 years.  Fecal occult blood test (FOBT) of the stool. You may have this test every year starting at age 50.  Flexible sigmoidoscopy or colonoscopy. You may have a sigmoidoscopy every 5 years or  a colonoscopy every 10 years starting at age 50.  Hepatitis C blood test.  Hepatitis B blood test.  Sexually transmitted disease (STD) testing.  Diabetes screening. This is done by checking your blood sugar (glucose) after you have not eaten for a while (fasting). You may have this done every 1-3 years.  Bone density scan. This is done to screen for osteoporosis. You may have this done starting at age 67.  Mammogram. This may be done every 1-2 years. Talk to your health care provider about how often you should have regular mammograms. Talk with your health care provider about your test results, treatment options, and if necessary, the need for more tests. Vaccines  Your health care provider may recommend certain vaccines, such as:  Influenza vaccine. This is recommended every year.  Tetanus, diphtheria, and acellular pertussis (Tdap, Td) vaccine. You may need a Td booster every 10 years.  Varicella vaccine. You may need this if you have not been vaccinated.  Zoster vaccine. You may need this after age 60.  Measles, mumps, and rubella (MMR) vaccine. You may need at least one dose of MMR if you were born in 1957 or later. You may also need a second dose.  Pneumococcal 13-valent conjugate (PCV13) vaccine. One dose is recommended after age 67.  Pneumococcal polysaccharide (PPSV23) vaccine. One dose is recommended after age 67.  Meningococcal vaccine. You may need this if you have certain conditions.  Hepatitis A vaccine. You may need this if you have certain conditions or if you travel or work in places where you may be exposed to   hepatitis A.  Hepatitis B vaccine. You may need this if you have certain conditions or if you travel or work in places where you may be exposed to hepatitis B.  Haemophilus influenzae type b (Hib) vaccine. You may need this if you have certain conditions. Talk to your health care provider about which screenings and vaccines you need and how often you need  them. This information is not intended to replace advice given to you by your health care provider. Make sure you discuss any questions you have with your health care provider. Document Released: 11/15/2015 Document Revised: 07/08/2016 Document Reviewed: 08/20/2015 Elsevier Interactive Patient Education  2017 [employer]  

## 2017-03-03 NOTE — Progress Notes (Signed)
Patient: Caitlyn Baker, Female    DOB: 10-22-1951, 66 y.o.   MRN: [id number] Visit Date: 03/03/2017  Today's Provider: Lelon Huh, MD   Chief Complaint  Patient presents with  . Medicare Wellness  . Hyperlipidemia   Subjective:    Annual wellness visit Caitlyn Baker is a 66 y.o. female. She feels well. She reports exercising some. She reports she is sleeping fairly well.  -----------------------------------------------------------     Lipid/Cholesterol, Follow-up:   Last seen for this 7 months ago.  Management since that visit includes; labs checked. Advised to avoid saturated fats.  Last Lipid Panel:    Component Value Date/Time   CHOL 200 (H) 08/27/2016 0900   TRIG 105 08/27/2016 0900   HDL 66 08/27/2016 0900   CHOLHDL 3.0 08/27/2016 0900   LDLCALC 113 (H) 08/27/2016 0900    She reports good compliance with treatment. She is not having side effects. none  Wt Readings from Last 3 Encounters:  03/03/17 141 lb (64 kg)  08/27/16 134 lb (60.8 kg)  05/21/16 133 lb (60.3 kg)    ----------------------------------------------------------------  She states she has been more fatigued than usual the last couple of months. Is not sleeping well due generalized muscle aches. She states she was already having muscle aches before starting statin as above, but they have been worsening over time. She is already taking 2 CoQ10 daily  Review of Systems  Constitutional: Positive for fatigue. Negative for chills and fever.  HENT: Negative for congestion, ear pain, rhinorrhea, sneezing and sore throat.   Eyes: Negative.  Negative for pain and redness.  Respiratory: Negative for cough, shortness of breath and wheezing.   Cardiovascular: Negative for chest pain and leg swelling.  Gastrointestinal: Negative for abdominal pain, blood in stool, constipation, diarrhea and nausea.  Endocrine: Negative for polydipsia and polyphagia.  Genitourinary: Negative.  Negative for dysuria,  flank pain, hematuria, pelvic pain, vaginal bleeding and vaginal discharge.  Musculoskeletal: Positive for myalgias. Negative for arthralgias, back pain, gait problem and joint swelling.  Skin: Negative for rash.  Neurological: Negative.  Negative for dizziness, tremors, seizures, weakness, light-headedness, numbness and headaches.  Hematological: Negative for adenopathy.  Psychiatric/Behavioral: Negative.  Negative for behavioral problems, confusion and dysphoric mood. The patient is not nervous/anxious and is not hyperactive.   All other systems reviewed and are negative.   Social History   Social History  . Marital status: Married    Spouse name: N/A  . Number of children: N/A  . Years of education: N/A   Occupational History  . Not on file.   Social History Main Topics  . Smoking status: Never Smoker  . Smokeless tobacco: Never Used  . Alcohol use No  . Drug use: No  . Sexual activity: Not on file   Other Topics Concern  . Not on file   Social History Narrative  . No narrative on file    History reviewed. No pertinent past medical history.   Patient Active Problem List   Diagnosis Date Noted  . Allergic rhinitis 11/21/2015  . Anxiety 11/21/2015  . Back ache 11/21/2015  . Chest pain 11/21/2015  . Fatigue 11/21/2015  . Blood glucose elevated 11/21/2015  . Osteopenia 11/21/2015  . Hand paresthesia 11/21/2015  . Hypercholesteremia 01/13/2007    Past Surgical History:  Procedure Laterality Date  . MOUTH SURGERY      Her family history includes Hypertension in her mother.      Current Outpatient Prescriptions:  .  aspirin 81 MG tablet, Take 81 mg by mouth daily., Disp: , Rfl:  .  Calcium Carbonate-Vitamin D 600-400 MG-UNIT tablet, Take by mouth., Disp: , Rfl:  .  Coenzyme Q10 (COQ10 PO), Take by mouth., Disp: , Rfl:  .  MULTIPLE VITAMIN PO, Take by mouth., Disp: , Rfl:  .  pravastatin (PRAVACHOL) 20 MG tablet, Take 1 tablet (20 mg total) by mouth daily.,  Disp: 30 tablet, Rfl: 11  Patient Care Team: Birdie Sons, MD as PCP - General (Family Medicine)     Objective:   Vitals: BP 120/70 (BP Location: Right Arm, Patient Position: Sitting, Cuff Size: Normal)   Pulse 88   Temp 98.2 F (36.8 C) (Oral)   Resp 16   Ht '5\' 4"'  (1.626 m)   Wt 141 lb (64 kg)   SpO2 98%   BMI 24.20 kg/m   Physical Exam   General Appearance:    Alert, cooperative, no distress, appears stated age  Head:    Normocephalic, without obvious abnormality, atraumatic  Eyes:    PERRL, conjunctiva/corneas clear, EOM's intact, fundi    benign, both eyes  Ears:    Normal TM's and external ear canals, both ears  Nose:   Nares normal, septum midline, mucosa normal, no drainage    or sinus tenderness  Throat:   Lips, mucosa, and tongue normal; teeth and gums normal  Neck:   Supple, symmetrical, trachea midline, no adenopathy;    thyroid:  no enlargement/tenderness/nodules; no carotid   bruit or JVD  Back:     Symmetric, no curvature, ROM normal, no CVA tenderness  Lungs:     Clear to auscultation bilaterally, respirations unlabored  Chest Wall:    No tenderness or deformity   Heart:    Regular rate and rhythm, S1 and S2 normal, no murmur, rub   or gallop  Breast Exam:    normal appearance, no masses or tenderness  Abdomen:     Soft, non-tender, bowel sounds active all four quadrants,    no masses, no organomegaly  Pelvic:    deferred  Extremities:   Extremities normal, atraumatic, no cyanosis or edema  Pulses:   2+ and symmetric all extremities  Skin:   Skin color, texture, turgor normal, no rashes or lesions  Lymph nodes:   Cervical, supraclavicular, and axillary nodes normal  Neurologic:   CNII-XII intact, normal strength, sensation and reflexes    throughout    Activities of Daily Living In your present state of health, do you have any difficulty performing the following activities: 03/03/2017  Hearing? N  Vision? N  Difficulty concentrating or making  decisions? N  Walking or climbing stairs? N  Dressing or bathing? N  Doing errands, shopping? N  Some recent data might be hidden    Fall Risk Assessment Fall Risk  03/03/2017 11/22/2015  Falls in the past year? No No     Depression Screen PHQ 2/9 Scores 03/03/2017 11/22/2015  PHQ - 2 Score 0 0  PHQ- 9 Score 4 -    Cognitive Testing - 6-CIT  Correct? Score   What year is it? yes 0 0 or 4  What month is it? yes 0 0 or 3  Memorize:    Pia Mau,  41,  [place],      What time is it? (within 1 hour) yes 0 0 or 3  Count backwards from 20 yes 0 0, 2, or 4  Name the months of the  year yes 0 0, 2, or 4  Repeat name & address above yes 0 0, 2, 4, 6, 8, or 10       TOTAL SCORE  0/28   Interpretation:  Normal  Normal (0-7) Abnormal (8-28)    Audit-C Alcohol Use Screening  Question Answer Points  How often do you have alcoholic drink? never 0  On days you do drink alcohol, how many drinks do you typically consume? n/a 0  How oftey will you drink 6 or more in a total? never 0  Total Score:  0   A score of 3 or more in women, and 4 or more in men indicates increased risk for alcohol abuse, EXCEPT if all of the points are from question 1.     Assessment & Plan:     Annual Wellness Visit  Reviewed patient's Family Medical History Reviewed and updated list of patient's medical providers Assessment of cognitive impairment was done Assessed patient's functional ability Established a written schedule for health screening [place] Completed and Reviewed  Exercise Activities and Dietary recommendations Goals    None      Immunization History  Administered Date(s) Administered  . Influenza-Unspecified 09/04/2015  . Pneumococcal Conjugate-13 11/22/2015  . Td 08/06/2004, 10/12/2014  . Tdap 10/12/2014    Health Maintenance  Topic Date Due  . COLONOSCOPY  12/23/2013  . PNA vac Low Risk Adult (2 of 2 - PPSV23) 11/21/2016  . INFLUENZA  VACCINE  06/02/2017  . MAMMOGRAM  12/11/2017  . DEXA SCAN  12/11/2018  . TETANUS/TDAP  10/12/2024  . Hepatitis C Screening  Completed     Discussed health benefits of physical activity, and encouraged her to engage in regular exercise appropriate for her age and condition.    --------------------------------------------------------------------------  1. Medicare annual wellness visit, subsequent   2. Osteopenia, unspecified location  - VITAMIN D 25 Hydroxy (Vit-D Deficiency, Fractures)  3. Hypercholesteremia She is tolerating pravastatin well with no adverse effects.   - Lipid panel  4. Colon cancer screening She states she never received Cologuard kit that was ordered last year.  - Cologuard  5. Need for pneumococcal vaccination Pneumovax-23  6. Other fatigue  - Comprehensive metabolic panel - CBC - T4 AND TSH  7. Myalgia Is already on CoQ10 - CK (Creatine Kinase) - Sed Rate (ESR)  The entirety of the information documented in the History of Present Illness, Review of Systems and Physical Exam were personally obtained by me. Portions of this information were initially documented by April M. Sabra Heck, CMA and reviewed by me for thoroughness and accuracy.    Lelon Huh, MD  [facility]

## 2017-03-04 ENCOUNTER — Other Ambulatory Visit: Payer: Self-pay | Admitting: Family Medicine

## 2017-03-04 DIAGNOSIS — Z1231 Encounter for screening mammogram for malignant neoplasm of breast: Secondary | ICD-10-CM

## 2017-03-04 LAB — CBC
Hematocrit: 37.9 % (ref 34.0–46.6)
Hemoglobin: 12.8 g/dL (ref 11.1–15.9)
MCH: 31.4 pg (ref 26.6–33.0)
MCHC: 33.8 g/dL (ref 31.5–35.7)
MCV: 93 fL (ref 79–97)
PLATELETS: 330 10*3/uL (ref 150–379)
RBC: 4.07 x10E6/uL (ref 3.77–5.28)
RDW: 12.8 % (ref 12.3–15.4)
WBC: 6.4 10*3/uL (ref 3.4–10.8)

## 2017-03-04 LAB — LIPID PANEL
Chol/HDL Ratio: 3.2 ratio (ref 0.0–4.4)
Cholesterol, Total: 218 mg/dL — ABNORMAL HIGH (ref 100–199)
HDL: 68 mg/dL (ref >39)
LDL Calculated: 124 mg/dL — ABNORMAL HIGH (ref 0–99)
Triglycerides: 131 mg/dL (ref 0–149)
VLDL Cholesterol Cal: 26 mg/dL (ref 5–40)

## 2017-03-04 LAB — COMPREHENSIVE METABOLIC PANEL
ALBUMIN: 4.6 g/dL (ref 3.6–4.8)
ALK PHOS: 75 IU/L (ref 39–117)
ALT: 11 IU/L (ref 0–32)
AST: 16 IU/L (ref 0–40)
Albumin/Globulin Ratio: 1.8 (ref 1.2–2.2)
BUN / CREAT RATIO: 19 (ref 12–28)
BUN: 12 mg/dL (ref 8–27)
Bilirubin Total: 0.5 mg/dL (ref 0.0–1.2)
CALCIUM: 10.4 mg/dL — AB (ref 8.7–10.3)
CHLORIDE: 102 mmol/L (ref 96–106)
CO2: 26 mmol/L (ref 18–29)
CREATININE: 0.62 mg/dL (ref 0.57–1.00)
GFR calc Af Amer: 109 mL/min/{1.73_m2} (ref >59)
GFR, EST NON AFRICAN AMERICAN: 94 mL/min/{1.73_m2} (ref >59)
GLOBULIN, TOTAL: 2.5 g/dL (ref 1.5–4.5)
GLUCOSE: 99 mg/dL (ref 65–99)
Potassium: 4.8 mmol/L (ref 3.5–5.2)
Sodium: 143 mmol/L (ref 134–144)
Total Protein: 7.1 g/dL (ref 6.0–8.5)

## 2017-03-04 LAB — CK: Total CK: 79 U/L (ref 24–173)

## 2017-03-04 LAB — VITAMIN D 25 HYDROXY (VIT D DEFICIENCY, FRACTURES): VIT D 25 HYDROXY: 25.7 ng/mL — AB (ref 30.0–100.0)

## 2017-03-04 LAB — T4 AND TSH
T4, Total: 7 ug/dL (ref 4.5–12.0)
TSH: 1.36 u[IU]/mL (ref 0.450–4.500)

## 2017-03-04 LAB — SEDIMENTATION RATE: SED RATE: 4 mm/h (ref 0–40)

## 2017-03-12 ENCOUNTER — Telehealth: Payer: Self-pay | Admitting: Family Medicine

## 2017-03-12 NOTE — Telephone Encounter (Signed)
Order for cologuard faxed to [facility] °

## 2017-03-26 ENCOUNTER — Ambulatory Visit
Admission: RE | Admit: 2017-03-26 | Discharge: 2017-03-26 | Disposition: A | Discharge status: Home / Self Care | Payer: Medicare Other | Source: Ambulatory Visit | Attending: Family Medicine | Admitting: Family Medicine

## 2017-03-26 DIAGNOSIS — Z1231 Encounter for screening mammogram for malignant neoplasm of breast: Secondary | ICD-10-CM | POA: Insufficient documentation

## 2017-07-21 DIAGNOSIS — Z23 Encounter for immunization: Secondary | ICD-10-CM | POA: Diagnosis not present

## 2017-07-26 DIAGNOSIS — H2513 Age-related nuclear cataract, bilateral: Secondary | ICD-10-CM | POA: Diagnosis not present

## 2017-09-13 ENCOUNTER — Other Ambulatory Visit: Payer: Self-pay | Admitting: Family Medicine

## 2017-09-13 DIAGNOSIS — E7800 Pure hypercholesterolemia, unspecified: Secondary | ICD-10-CM

## 2017-09-13 MED ORDER — PRAVASTATIN SODIUM 20 MG PO TABS: 20.0000 mg | ORAL_TABLET | Freq: Every day | ORAL | 4 refills | Status: DC

## 2017-09-13 NOTE — Telephone Encounter (Signed)
Requesting 90 day supply.

## 2017-09-13 NOTE — Telephone Encounter (Signed)
[facility] pharmacy faxed a request for a 90-days supply for the following medication. Thanks CC  pravastatin (PRAVACHOL) 20 MG tablet

## 2017-10-19 ENCOUNTER — Encounter: Payer: Self-pay | Admitting: Family Medicine

## 2017-10-19 ENCOUNTER — Ambulatory Visit (INDEPENDENT_AMBULATORY_CARE_PROVIDER_SITE_OTHER): Payer: Medicare Other | Admitting: Family Medicine

## 2017-10-19 VITALS — BP 140/80 | HR 106 | Temp 98.1°F | Resp 16 | Wt 146.0 lb

## 2017-10-19 DIAGNOSIS — R0600 Dyspnea, unspecified: Secondary | ICD-10-CM | POA: Diagnosis not present

## 2017-10-19 DIAGNOSIS — R5383 Other fatigue: Secondary | ICD-10-CM

## 2017-10-19 DIAGNOSIS — R252 Cramp and spasm: Secondary | ICD-10-CM | POA: Diagnosis not present

## 2017-10-19 DIAGNOSIS — M791 Myalgia, unspecified site: Secondary | ICD-10-CM | POA: Diagnosis not present

## 2017-10-19 NOTE — Patient Instructions (Addendum)
   Start taking OTC B-Complex vitamins every day   Start taking OTC Coenzyme Q10 200mg  once a day

## 2017-10-19 NOTE — Progress Notes (Signed)
Patient: Caitlyn Baker Female    DOB: 1950-11-12   66 y.o.   MRN: [id number] Visit Date: 10/19/2017  Today's Provider: Lelon Huh, MD   No chief complaint on file.  Subjective:    Patient has been having cramps in her feet and legs. Patient states that over the week cramps have become more frequent, occurring several times a day. Other symptoms include fatigue, lightheadedness, increased heart rate.     Have been occurring in feet off and on for a few months. Not bothering her at night. Now feeling numb in right leg. Today cramps occur when walking and also when at rest.   She also states she feels like her left leg is numb and heavy for the last day. No known injury. Does have some mild chronic back pain. Numbness seems to be around mid thigh. She is taking aspirin every day.  She also reports she is more easily fatigued and gets short of breath with exertion for several weeks.   Patient Active Problem List   Diagnosis Date Noted  . Allergic rhinitis 11/21/2015  . Anxiety 11/21/2015  . Back ache 11/21/2015  . Chest pain 11/21/2015  . Fatigue 11/21/2015  . Blood glucose elevated 11/21/2015  . Osteopenia 11/21/2015  . Hand paresthesia 11/21/2015  . Hypercholesteremia 01/13/2007   History reviewed. No pertinent past medical history.    Allergies  Allergen Reactions  . Codeine   . Prednisone     Other reaction(s): Chest pain Severe anxiety and depression.     Current Outpatient Medications:  .  aspirin 81 MG tablet, Take 81 mg by mouth daily., Disp: , Rfl:  .  Calcium Carbonate-Vitamin D 600-400 MG-UNIT tablet, Take by mouth., Disp: , Rfl:  .  Coenzyme Q10 (COQ10 PO), Take by mouth., Disp: , Rfl:  .  MULTIPLE VITAMIN PO, Take by mouth., Disp: , Rfl:  .  pravastatin (PRAVACHOL) 20 MG tablet, Take 1 tablet (20 mg total) daily by mouth., Disp: 90 tablet, Rfl: 4  Review of Systems  Constitutional: Negative for appetite change, chills, fatigue and fever.    Respiratory: Negative for chest tightness and shortness of breath.   Cardiovascular: Negative for chest pain and palpitations.  Gastrointestinal: Negative for abdominal pain, nausea and vomiting.  Neurological: Negative for dizziness and weakness.    Social History   Tobacco Use  . Smoking status: Never Smoker  . Smokeless tobacco: Never Used  Substance Use Topics  . Alcohol use: No   Objective:   BP 140/80 (BP Location: Right Arm, Patient Position: Sitting, Cuff Size: Normal)   Pulse (!) 106   Temp 98.1 F (36.7 C) (Oral)   Resp 16   Wt 146 lb (66.2 kg)   SpO2 98%   BMI 25.06 kg/m  Vitals:   10/19/17 1031  BP: 140/80  Pulse: (!) 106  Resp: 16  Temp: 98.1 F (36.7 C)  TempSrc: Oral  SpO2: 98%  Weight: 146 lb (66.2 kg)     Physical Exam   General Appearance:    Alert, cooperative, no distress  Eyes:    PERRL, conjunctiva/corneas clear, EOM's intact       Lungs:     Clear to auscultation bilaterally, respirations unlabored  Heart:    Regular rate and rhythm  Neurologic:   Awake, alert, oriented x 3. No apparent focal neurological           defect. Normal s/s LE. Normal strength both LEs. DTRs +2  bilateral LEs.           Assessment & Plan:     1. Cramp in limb  - COMPLETE METABOLIC PANEL WITH GFR - CBC - Magnesium - TSH - Brain natriuretic peptide - T4, free  2. Other fatigue  - COMPLETE METABOLIC PANEL WITH GFR - CBC - Magnesium - TSH - Brain natriuretic peptide - T4, free  3. Dyspnea, unspecified type  - COMPLETE METABOLIC PANEL WITH GFR - CBC - Magnesium - TSH - Brain natriuretic peptide - T4, free  4. Left leg numbness Consider neuroimaging if labs normal.        Lelon Huh, MD  [facility]

## 2017-10-20 ENCOUNTER — Other Ambulatory Visit: Payer: Self-pay | Admitting: Emergency Medicine

## 2017-10-20 DIAGNOSIS — M791 Myalgia, unspecified site: Secondary | ICD-10-CM

## 2017-10-25 LAB — COMPLETE METABOLIC PANEL WITH GFR
AG RATIO: 1.6 (calc) (ref 1.0–2.5)
ALT: 14 U/L (ref 6–29)
AST: 18 U/L (ref 10–35)
Albumin: 4.5 g/dL (ref 3.6–5.1)
Alkaline phosphatase (APISO): 66 U/L (ref 33–130)
BILIRUBIN TOTAL: 0.6 mg/dL (ref 0.2–1.2)
BUN: 12 mg/dL (ref 7–25)
CHLORIDE: 103 mmol/L (ref 98–110)
CO2: 30 mmol/L (ref 20–32)
Calcium: 10.4 mg/dL (ref 8.6–10.4)
Creat: 0.66 mg/dL (ref 0.50–0.99)
GFR, Est African American: 106 mL/min/{1.73_m2} (ref >=60)
GFR, Est Non African American: 91 mL/min/{1.73_m2} (ref >=60)
GLUCOSE: 101 mg/dL — AB (ref 65–99)
Globulin: 2.8 g/dL (calc) (ref 1.9–3.7)
POTASSIUM: 4.3 mmol/L (ref 3.5–5.3)
Sodium: 139 mmol/L (ref 135–146)
TOTAL PROTEIN: 7.3 g/dL (ref 6.1–8.1)

## 2017-10-25 LAB — TEST AUTHORIZATION

## 2017-10-25 LAB — CBC
HCT: 38.9 % (ref 35.0–45.0)
HEMOGLOBIN: 13.1 g/dL (ref 11.7–15.5)
MCH: 31 pg (ref 27.0–33.0)
MCHC: 33.7 g/dL (ref 32.0–36.0)
MCV: 92.2 fL (ref 80.0–100.0)
MPV: 10.7 fL (ref 7.5–12.5)
PLATELETS: 317 10*3/uL (ref 140–400)
RBC: 4.22 10*6/uL (ref 3.80–5.10)
RDW: 11.8 % (ref 11.0–15.0)
WBC: 5.4 10*3/uL (ref 3.8–10.8)

## 2017-10-25 LAB — TSH: TSH: 1.11 m[IU]/L (ref 0.40–4.50)

## 2017-10-25 LAB — T4, FREE: Free T4: 1.2 ng/dL (ref 0.8–1.8)

## 2017-10-25 LAB — CK TOTAL AND CKMB (NOT AT ARMC): CK TOTAL: 88 U/L (ref 29–143)

## 2017-10-25 LAB — MAGNESIUM: Magnesium: 2 mg/dL (ref 1.5–2.5)

## 2017-10-25 LAB — BRAIN NATRIURETIC PEPTIDE: BRAIN NATRIURETIC PEPTIDE: 23 pg/mL (ref <100)

## 2017-10-27 ENCOUNTER — Telehealth: Payer: Self-pay | Admitting: Family Medicine

## 2017-10-27 NOTE — Telephone Encounter (Signed)
Pt has not heard back from her labs work yet  Thanks teri

## 2017-10-27 NOTE — Telephone Encounter (Signed)
Patient was notified of results.  

## 2018-03-05 ENCOUNTER — Other Ambulatory Visit: Payer: Self-pay | Admitting: Family Medicine

## 2018-03-05 DIAGNOSIS — E7800 Pure hypercholesterolemia, unspecified: Secondary | ICD-10-CM

## 2018-04-05 ENCOUNTER — Ambulatory Visit (INDEPENDENT_AMBULATORY_CARE_PROVIDER_SITE_OTHER): Payer: Medicare Other

## 2018-04-05 VITALS — BP 132/82 | HR 81 | Temp 98.6°F | Ht 64.0 in | Wt 148.0 lb

## 2018-04-05 DIAGNOSIS — Z0000 Encounter for general adult medical examination without abnormal findings: Secondary | ICD-10-CM | POA: Diagnosis not present

## 2018-04-05 NOTE — Progress Notes (Signed)
Subjective:   Caitlyn Baker is a 67 y.o. female who presents for Medicare Annual (Subsequent) preventive examination.  Review of Systems:  N/A  Cardiac Risk Factors include: advanced age (>43men, >51 women)     Objective:     Vitals: BP 132/82 (BP Location: Right Arm)   Pulse 81   Temp 98.6 F (37 C) (Oral)   Ht 5\' 4"  (1.626 m)   Wt 148 lb (67.1 kg)   BMI 25.40 kg/m   Body mass index is 25.4 kg/m.  Advanced Directives 04/05/2018 03/03/2017 11/22/2015 11/22/2015  Does Patient Have a Medical Advance Directive? Yes Yes Yes No  Type of Paramedic of [place];Living will Living will [place];Living will -  Does patient want to make changes to medical advance directive? - No - Patient declined - -  Copy of [place] in Chart? No - copy requested - - -    Tobacco Social History   Tobacco Use  Smoking Status Never Smoker  Smokeless Tobacco Never Used     Counseling given: Not Answered   Clinical Intake:  Pre-visit preparation completed: Yes  Pain : No/denies pain Pain Score: 0-No pain     Nutritional Status: BMI 25 -29 Overweight Nutritional Risks: None Diabetes: No  How often do you need to have someone help you when you read instructions, pamphlets, or other written materials from your doctor or pharmacy?: 1 - Never  Interpreter Needed?: No  Information entered by :: [facility], LPN  Past Medical History:  Diagnosis Date  . Hyperlipidemia    Past Surgical History:  Procedure Laterality Date  . MOUTH SURGERY     Family History  Problem Relation Age of Onset  . Hypertension Mother   . Breast cancer Neg Hx    Social History   Socioeconomic History  . Marital status: Married    Spouse name: Not on file  . Number of children: 3  . Years of education: Not on file  . Highest education level: Some college, no degree  Occupational History  . Occupation: retired  Scientific laboratory technician  . Financial  resource strain: Not hard at all  . Food insecurity:    Worry: Never true    Inability: Never true  . Transportation needs:    Medical: No    Non-medical: No  Tobacco Use  . Smoking status: Never Smoker  . Smokeless tobacco: Never Used  Substance and Sexual Activity  . Alcohol use: No  . Drug use: No  . Sexual activity: Not on file  Lifestyle  . Physical activity:    Days per week: Not on file    Minutes per session: Not on file  . Stress: Not at all  Relationships  . Social connections:    Talks on phone: Not on file    Gets together: Not on file    Attends religious service: Not on file    Active member of club or organization: Not on file    Attends meetings of clubs or organizations: Not on file    Relationship status: Not on file  Other Topics Concern  . Not on file  Social History Narrative  . Not on file    Outpatient Encounter Medications as of 04/05/2018  Medication Sig  . aspirin 81 MG tablet Take 81 mg by mouth daily.  . Calcium Carbonate-Vitamin D 600-400 MG-UNIT tablet Take 1 tablet by mouth daily.   . Coenzyme Q10 (COQ10 PO) Take by  mouth daily.   Marland Kitchen lovastatin (MEVACOR) 20 MG tablet Please specify directions, refills and quantity (Patient taking differently: Please specify directions, refills and quantity (20 mg daily))  . MULTIPLE VITAMIN PO Take by mouth daily.    No facility-administered encounter medications on file as of 04/05/2018.     Activities of Daily Living In your present state of health, do you have any difficulty performing the following activities: 04/05/2018  Hearing? N  Vision? N  Difficulty concentrating or making decisions? N  Walking or climbing stairs? N  Dressing or bathing? N  Doing errands, shopping? N  Preparing Food and eating ? N  Using the Toilet? N  In the past six months, have you accidently leaked urine? N  Do you have problems with loss of bowel control? N  Managing your Medications? N  Managing your Finances? N    Housekeeping or managing your Housekeeping? N  Some recent data might be hidden    Patient Care Team: Birdie Sons, MD as PCP - General (Family Medicine)    Assessment:   This is a routine wellness examination for [place].  Exercise Activities and Dietary recommendations Current Exercise Habits: Home exercise routine, Type of exercise: walking, Time (Minutes): 15, Frequency (Times/Week): 2, Weekly Exercise (Minutes/Week): 30, Intensity: Mild  Goals    . DIET - INCREASE WATER INTAKE     Recommend increasing water intake to 5-6 glasses a day.        Fall Risk Fall Risk  04/05/2018 03/03/2017 11/22/2015  Falls in the past year? No No No   Is the patient's home free of loose throw rugs in walkways, pet beds, electrical cords, etc?   yes      Grab bars in the bathroom? no      Handrails on the stairs?   no      Adequate lighting?   yes  Timed Get Up and Go performed: N/A  Depression Screen PHQ 2/9 Scores 04/05/2018 03/03/2017 11/22/2015  PHQ - 2 Score 0 0 0  PHQ- 9 Score - 4 -     Cognitive Function     6CIT Screen 04/05/2018  What Year? 0 points  What month? 0 points  What time? 0 points  Count back from 20 0 points  Months in reverse 0 points  Repeat phrase 0 points  Total Score 0    Immunization History  Administered Date(s) Administered  . Influenza Split 07/19/2010  . Influenza,inj,Quad PF,6+ Mos 07/31/2013  . Influenza-Unspecified 09/04/2015, 08/16/2017  . Pneumococcal Conjugate-13 11/22/2015  . Pneumococcal Polysaccharide-23 03/03/2017  . Td 08/06/2004, 10/12/2014  . Tdap 10/12/2014    Qualifies for Shingles Vaccine? Due for Shingles vaccine. Declined my offer to administer today. Education has been provided regarding the importance of this vaccine. Pt has been advised to call her insurance company to determine her out of pocket expense. Advised she may also receive this vaccine at her local pharmacy or Health Dept. Verbalized acceptance and  understanding.  Screening Tests Health Maintenance  Topic Date Due  . INFLUENZA VACCINE  06/02/2018  . DEXA SCAN  12/11/2018  . MAMMOGRAM  03/27/2019  . Fecal DNA (Cologuard)  05/22/2019  . TETANUS/TDAP  10/12/2024  . Hepatitis C Screening  Completed  . PNA vac Low Risk Adult  Completed    Cancer Screenings: Lung: Low Dose CT Chest recommended if Age 51-80 years, 30 pack-year currently smoking OR have quit w/in 15years. Patient does not qualify. Breast:  Up to date on Mammogram?  Yes   Up to date of Bone Density/Dexa? Yes Colorectal: Up to date  Additional Screenings:  Hepatitis C Screening: Up to date     Plan:  I have personally reviewed and addressed the Medicare Annual Wellness questionnaire and have noted the following in the patient's chart:  A. Medical and social history B. Use of alcohol, tobacco or illicit drugs  C. Current medications and supplements D. Functional ability and status E.  Nutritional status F.  Physical activity G. Advance directives H. List of other physicians I.  Hospitalizations, surgeries, and ER visits in previous 12 months J.  [place] such as hearing and vision if needed, cognitive and depression L. Referrals and appointments - none  In addition, I have reviewed and discussed with patient certain preventive protocols, quality metrics, and best practice recommendations. A written personalized care plan for preventive services as well as general preventive health recommendations were provided to patient.  See attached scanned questionnaire for additional information.   Signed,  Fabio Neighbors, LPN Nurse Health Advisor   Nurse Recommendations: None.

## 2018-04-05 NOTE — Patient Instructions (Addendum)
Caitlyn Baker , Thank you for taking time to come for your Medicare Wellness Visit. I appreciate your ongoing commitment to your health goals. Please review the following plan we discussed and let me know if I can assist you in the future.   Screening recommendations/referrals: Colonoscopy: Up to date Mammogram: Up to date Bone Density: Up to date Recommended yearly ophthalmology/optometry visit for glaucoma screening and checkup Recommended yearly dental visit for hygiene and checkup  Vaccinations: Influenza vaccine: Up to date Pneumococcal vaccine: Up to date Tdap vaccine: Up to date Shingles vaccine: Pt declines today.     Advanced directives: Please bring a copy of your POA (Power of Attorney) and/or Living Will to your next appointment.   Conditions/risks identified: Recommend increasing water intake to 5-6 glasses a day.   Next appointment: 04/08/18 @ 10 AM with Dr Caryn Section.    Preventive Care 40 Years and Older, Female Preventive care refers to lifestyle choices and visits with your health care provider that can promote health and wellness. What does preventive care include?  A yearly physical exam. This is also called an annual well check.  Dental exams once or twice a year.  Routine eye exams. Ask your health care provider how often you should have your eyes checked.  Personal lifestyle choices, including:  Daily care of your teeth and gums.  Regular physical activity.  Eating a healthy diet.  Avoiding tobacco and drug use.  Limiting alcohol use.  Practicing safe sex.  Taking low-dose aspirin every day.  Taking vitamin and mineral supplements as recommended by your health care provider. What happens during an annual well check? The services and screenings done by your health care provider during your annual well check will depend on your age, overall health, lifestyle risk factors, and family history of disease. Counseling  Your health care provider may ask you  questions about your:  Alcohol use.  Tobacco use.  Drug use.  Emotional well-being.  Home and relationship well-being.  Sexual activity.  Eating habits.  History of falls.  Memory and ability to understand (cognition).  Work and work Statistician.  Reproductive health. Screening  You may have the following tests or measurements:  Height, weight, and BMI.  Blood pressure.  Lipid and cholesterol levels. These may be checked every 5 years, or more frequently if you are over 59 years old.  Skin check.  Lung cancer screening. You may have this screening every year starting at age 69 if you have a 30-pack-year history of smoking and currently smoke or have quit within the past 15 years.  Fecal occult blood test (FOBT) of the stool. You may have this test every year starting at age 37.  Flexible sigmoidoscopy or colonoscopy. You may have a sigmoidoscopy every 5 years or a colonoscopy every 10 years starting at age 85.  Hepatitis C blood test.  Hepatitis B blood test.  Sexually transmitted disease (STD) testing.  Diabetes screening. This is done by checking your blood sugar (glucose) after you have not eaten for a while (fasting). You may have this done every 1-3 years.  Bone density scan. This is done to screen for osteoporosis. You may have this done starting at age 64.  Mammogram. This may be done every 1-2 years. Talk to your health care provider about how often you should have regular mammograms. Talk with your health care provider about your test results, treatment options, and if necessary, the need for more tests. Vaccines  Your health care provider may  recommend certain vaccines, such as:  Influenza vaccine. This is recommended every year.  Tetanus, diphtheria, and acellular pertussis (Tdap, Td) vaccine. You may need a Td booster every 10 years.  Zoster vaccine. You may need this after age 68.  Pneumococcal 13-valent conjugate (PCV13) vaccine. One dose is  recommended after age 35.  Pneumococcal polysaccharide (PPSV23) vaccine. One dose is recommended after age 14. Talk to your health care provider about which screenings and vaccines you need and how often you need them. This information is not intended to replace advice given to you by your health care provider. Make sure you discuss any questions you have with your health care provider. Document Released: 11/15/2015 Document Revised: 07/08/2016 Document Reviewed: 08/20/2015 Elsevier Interactive Patient Education  2017 [place] Prevention in the Home Falls can cause injuries. They can happen to people of all ages. There are many things you can do to make your home safe and to help prevent falls. What can I do on the outside of my home?  Regularly fix the edges of walkways and driveways and fix any cracks.  Remove anything that might make you trip as you walk through a door, such as a raised step or threshold.  Trim any bushes or trees on the path to your home.  Use bright outdoor lighting.  Clear any walking paths of anything that might make someone trip, such as rocks or tools.  Regularly check to see if handrails are loose or broken. Make sure that both sides of any steps have handrails.  Any raised decks and porches should have guardrails on the edges.  Have any leaves, snow, or ice cleared regularly.  Use sand or salt on walking paths during winter.  Clean up any spills in your garage right away. This includes oil or grease spills. What can I do in the bathroom?  Use night lights.  Install grab bars by the toilet and in the tub and shower. Do not use towel bars as grab bars.  Use non-skid mats or decals in the tub or shower.  If you need to sit down in the shower, use a plastic, non-slip stool.  Keep the floor dry. Clean up any water that spills on the floor as soon as it happens.  Remove soap buildup in the tub or shower regularly.  Attach bath mats  securely with double-sided non-slip rug tape.  Do not have throw rugs and other things on the floor that can make you trip. What can I do in the bedroom?  Use night lights.  Make sure that you have a light by your bed that is easy to reach.  Do not use any sheets or blankets that are too big for your bed. They should not hang down onto the floor.  Have a firm chair that has side arms. You can use this for support while you get dressed.  Do not have throw rugs and other things on the floor that can make you trip. What can I do in the kitchen?  Clean up any spills right away.  Avoid walking on wet floors.  Keep items that you use a lot in easy-to-reach places.  If you need to reach something above you, use a strong step stool that has a grab bar.  Keep electrical cords out of the way.  Do not use floor polish or wax that makes floors slippery. If you must use wax, use non-skid floor wax.  Do not have throw rugs and  other things on the floor that can make you trip. What can I do with my stairs?  Do not leave any items on the stairs.  Make sure that there are handrails on both sides of the stairs and use them. Fix handrails that are broken or loose. Make sure that handrails are as long as the stairways.  Check any carpeting to make sure that it is firmly attached to the stairs. Fix any carpet that is loose or worn.  Avoid having throw rugs at the top or bottom of the stairs. If you do have throw rugs, attach them to the floor with carpet tape.  Make sure that you have a light switch at the top of the stairs and the bottom of the stairs. If you do not have them, ask someone to add them for you. What else can I do to help prevent falls?  Wear shoes that:  Do not have high heels.  Have rubber bottoms.  Are comfortable and fit you well.  Are closed at the toe. Do not wear sandals.  If you use a stepladder:  Make sure that it is fully opened. Do not climb a closed  stepladder.  Make sure that both sides of the stepladder are locked into place.  Ask someone to hold it for you, if possible.  Clearly mark and make sure that you can see:  Any grab bars or handrails.  First and last steps.  Where the edge of each step is.  Use tools that help you move around (mobility aids) if they are needed. These include:  Canes.  Walkers.  Scooters.  Crutches.  Turn on the lights when you go into a dark area. Replace any light bulbs as soon as they burn out.  Set up your furniture so you have a clear path. Avoid moving your furniture around.  If any of your floors are uneven, fix them.  If there are any pets around you, be aware of where they are.  Review your medicines with your doctor. Some medicines can make you feel dizzy. This can increase your chance of falling. Ask your doctor what other things that you can do to help prevent falls. This information is not intended to replace advice given to you by your health care provider. Make sure you discuss any questions you have with your health care provider. Document Released: 08/15/2009 Document Revised: 03/26/2016 Document Reviewed: 11/23/2014 Elsevier Interactive Patient Education  2017 [employer].

## 2018-04-08 ENCOUNTER — Ambulatory Visit (INDEPENDENT_AMBULATORY_CARE_PROVIDER_SITE_OTHER): Payer: Medicare Other | Admitting: Family Medicine

## 2018-04-08 ENCOUNTER — Encounter: Payer: Self-pay | Admitting: Family Medicine

## 2018-04-08 VITALS — BP 148/88 | HR 79 | Temp 97.9°F | Resp 16 | Ht 64.0 in | Wt 149.0 lb

## 2018-04-08 DIAGNOSIS — E7800 Pure hypercholesterolemia, unspecified: Secondary | ICD-10-CM

## 2018-04-08 DIAGNOSIS — Z1211 Encounter for screening for malignant neoplasm of colon: Secondary | ICD-10-CM

## 2018-04-08 DIAGNOSIS — M8580 Other specified disorders of bone density and structure, unspecified site: Secondary | ICD-10-CM | POA: Diagnosis not present

## 2018-04-08 DIAGNOSIS — E559 Vitamin D deficiency, unspecified: Secondary | ICD-10-CM

## 2018-04-08 NOTE — Progress Notes (Signed)
Patient: Caitlyn Baker Female    DOB: 12-30-1950   67 y.o.   MRN: [id number] Visit Date: 04/08/2018  Today's Provider: Lelon Huh, MD   Chief Complaint  Patient presents with  . Hyperlipidemia   Subjective:   Patient saw [facility] for AWV on 04/05/2018.   HPI   BP Readings from Last 5 Encounters:  04/08/18 (!) 148/88  04/05/18 132/82  10/19/17 140/80  03/03/17 120/70  08/27/16 122/72     Lipid/Cholesterol, Follow-up:   Last seen for this 03/03/2017-labs checked, no changes. Last Lipid Panel:    Component Value Date/Time   CHOL 218 (H) 03/03/2017 1003   TRIG 131 03/03/2017 1003   HDL 68 03/03/2017 1003   CHOLHDL 3.2 03/03/2017 1003   LDLCALC 124 (H) 03/03/2017 1003    She reports good compliance with treatment. She is not having side effects.   Wt Readings from Last 3 Encounters:  04/08/18 149 lb (67.6 kg)  04/05/18 148 lb (67.1 kg)  10/19/17 146 lb (66.2 kg)    ------------------------------------------------------------------------  Osteopenia, unspecified location From 03/03/2017-started VITAMIN D3 2,000 units qd. Patient reports good compliance with treatment. Last BMD was 12/12/2015   Vitamin D Deficiency From 03/03/2017-started VITAMIN D3 2,000 units qd. Patient reports good compliance with treatment. Lab Results  Component Value Date   VD25OH 25.7 (L) 03/03/2017     Allergies  Allergen Reactions  . Codeine Nausea Only and Nausea And Vomiting  . Prednisone     Other reaction(s): Chest pain Severe anxiety and depression.     Current Outpatient Medications:  .  aspirin 81 MG tablet, Take 81 mg by mouth daily., Disp: , Rfl:  .  Calcium Carbonate-Vitamin D 600-400 MG-UNIT tablet, Take 1 tablet by mouth daily. , Disp: , Rfl:  .  Cholecalciferol (VITAMIN D PO), Take 1 tablet by mouth daily., Disp: , Rfl:  .  Coenzyme Q10 (COQ10 PO), Take by mouth daily. , Disp: , Rfl:  .  lovastatin (MEVACOR) 20 MG tablet, Please specify directions, refills and  quantity (Patient taking differently: Please specify directions, refills and quantity (20 mg daily)), Disp: 90 tablet, Rfl: 0 .  MULTIPLE VITAMIN PO, Take by mouth daily. , Disp: , Rfl:   Review of Systems  Constitutional: Negative for appetite change, chills, fatigue and fever.  Respiratory: Negative for chest tightness and shortness of breath.   Cardiovascular: Negative for chest pain and palpitations.  Gastrointestinal: Negative for abdominal pain, nausea and vomiting.  Neurological: Negative for dizziness and weakness.    Social History   Tobacco Use  . Smoking status: Never Smoker  . Smokeless tobacco: Never Used  Substance Use Topics  . Alcohol use: No   Objective:   BP (!) 148/88 (BP Location: Right Arm, Cuff Size: Normal)   Pulse 79   Temp 97.9 F (36.6 C) (Oral)   Resp 16   Ht 5\' 4"  (1.626 m)   Wt 149 lb (67.6 kg)   SpO2 97% Comment: room air  BMI 25.58 kg/m  Vitals:   04/08/18 1000 04/08/18 1004  BP: (!) 150/82 (!) 148/88  Pulse: 79   Resp: 16   Temp: 97.9 F (36.6 C)   TempSrc: Oral   SpO2: 97%   Weight: 149 lb (67.6 kg)   Height: 5\' 4"  (1.626 m)      Physical Exam     General Appearance:    Alert, cooperative, no distress, appears stated age  Head:    Normocephalic,  without obvious abnormality, atraumatic  Eyes:    PERRL, conjunctiva/corneas clear, EOM's intact, fundi    benign, both eyes  Ears:    Normal TM's and external ear canals, both ears  Nose:   Nares normal, septum midline, mucosa normal, no drainage    or sinus tenderness  Throat:   Lips, mucosa, and tongue normal; teeth and gums normal  Neck:   Supple, symmetrical, trachea midline, no adenopathy;    thyroid:  no enlargement/tenderness/nodules; no carotid   bruit or JVD  Back:     Symmetric, no curvature, ROM normal, no CVA tenderness  Lungs:     Clear to auscultation bilaterally, respirations unlabored  Chest Wall:    No tenderness or deformity   Heart:    Regular rate and rhythm,  S1 and S2 normal, no murmur, rub   or gallop  Breast Exam:    normal appearance, no masses or tenderness  Abdomen:     Soft, non-tender, bowel sounds active all four quadrants,    no masses, no organomegaly  Pelvic:    deferred and not indicated; post-menopausal, no abnormal Pap smears in past  Extremities:   Extremities normal, atraumatic, no cyanosis or edema  Pulses:   2+ and symmetric all extremities  Skin:   Skin color, texture, turgor normal, no rashes or lesions  Lymph nodes:   Cervical, supraclavicular, and axillary nodes normal  Neurologic:   CNII-XII intact, normal strength, sensation and reflexes    throughout    Audit-C Alcohol Use Screening  Question Answer Points  How often do you have alcoholi c drink? Never 0  How many drinks do you typically consume in a day? Never 0  How oftey will you drink 6 or more in a total? never 0  Total Score:  0   A score of 3 or more in women, and 4 or more in men indicates increased risk for alcohol abuse, EXCEPT if all of the points are from question 1.   General Appearance:    Alert, cooperative, no distress, appears stated age  Head:    Normocephalic, without obvious abnormality, atraumatic  Eyes:    PERRL, conjunctiva/corneas clear, EOM's intact, fundi    benign, both eyes  Ears:    Normal TM's and external ear canals, both ears  Nose:   Nares normal, septum midline, mucosa normal, no drainage    or sinus tenderness  Throat:   Lips, mucosa, and tongue normal; teeth and gums normal  Neck:   Supple, symmetrical, trachea midline, no adenopathy;    thyroid:  no enlargement/tenderness/nodules; no carotid   bruit or JVD  Back:     Symmetric, no curvature, ROM normal, no CVA tenderness  Lungs:     Clear to auscultation bilaterally, respirations unlabored  Chest Wall:    No tenderness or deformity   Heart:    Regular rate and rhythm, S1 and S2 normal, no murmur, rub   or gallop  Breast Exam:    normal appearance, no masses or  tenderness, deferred  Abdomen:     Soft, non-tender, bowel sounds active all four quadrants,    no masses, no organomegaly  Pelvic:    not indicated; post-menopausal, no abnormal Pap smears in past  Extremities:   Extremities normal, atraumatic, no cyanosis or edema  Pulses:   2+ and symmetric all extremities  Skin:   Skin color, texture, turgor normal, no rashes or lesions  Lymph nodes:   Cervical, supraclavicular, and axillary nodes normal  Neurologic:   CNII-XII intact, normal strength, sensation and reflexes    throughout       Assessment & Plan:     1. Vitamin D deficiency Due to check vitamin d levels  - VITAMIN D 25 Hydroxy (Vit-D Deficiency, Fractures)  2. Osteopenia, unspecified location BMD due in 2020 - VITAMIN D 25 Hydroxy (Vit-D Deficiency, Fractures)  3. Hypercholesteremia She is tolerating lovastatin well with no adverse effects.   - Lipid panel  4. Colon cancer screening  - Cologuard  She is noted to have elevated blood pressure today, she states she checks BP a few times a week at home and it stays consistently in the 120s.       Lelon Huh, MD  [facility]

## 2018-04-08 NOTE — Patient Instructions (Signed)
   The CDC recommends two doses of Shingrix (the shingles vaccine) separated by 2 to 6 months for adults age 68 years and older. I recommend checking with your insurance plan regarding coverage for this vaccine.   

## 2018-04-12 ENCOUNTER — Telehealth: Payer: Self-pay

## 2018-04-12 DIAGNOSIS — M8580 Other specified disorders of bone density and structure, unspecified site: Secondary | ICD-10-CM | POA: Diagnosis not present

## 2018-04-12 DIAGNOSIS — E559 Vitamin D deficiency, unspecified: Secondary | ICD-10-CM | POA: Diagnosis not present

## 2018-04-12 DIAGNOSIS — E7800 Pure hypercholesterolemia, unspecified: Secondary | ICD-10-CM | POA: Diagnosis not present

## 2018-04-12 NOTE — Telephone Encounter (Signed)
Faxed cologuard order requisition form to [facility].

## 2018-04-13 ENCOUNTER — Telehealth: Payer: Self-pay | Admitting: *Deleted

## 2018-04-13 LAB — VITAMIN D 25 HYDROXY (VIT D DEFICIENCY, FRACTURES): VIT D 25 HYDROXY: 32.6 ng/mL (ref 30.0–100.0)

## 2018-04-13 LAB — LIPID PANEL
CHOL/HDL RATIO: 3.2 ratio (ref 0.0–4.4)
Cholesterol, Total: 191 mg/dL (ref 100–199)
HDL: 59 mg/dL (ref >39)
LDL Calculated: 109 mg/dL — ABNORMAL HIGH (ref 0–99)
TRIGLYCERIDES: 117 mg/dL (ref 0–149)
VLDL Cholesterol Cal: 23 mg/dL (ref 5–40)

## 2018-04-13 NOTE — Telephone Encounter (Signed)
-----   Message from Birdie Sons, MD sent at 04/13/2018  7:56 AM EDT ----- Cholesterol is good at 191. Vitamin d levels normal. Continue current medications.  Check labs yearly.

## 2018-04-13 NOTE — Telephone Encounter (Signed)
LMOVM for pt to return call 

## 2018-04-19 NOTE — Telephone Encounter (Signed)
Left detailed message on pt's vm. Okay per dpr.  

## 2018-04-25 ENCOUNTER — Other Ambulatory Visit: Payer: Self-pay | Admitting: Family Medicine

## 2018-04-25 DIAGNOSIS — Z1231 Encounter for screening mammogram for malignant neoplasm of breast: Secondary | ICD-10-CM

## 2018-05-09 DIAGNOSIS — Z1211 Encounter for screening for malignant neoplasm of colon: Secondary | ICD-10-CM | POA: Diagnosis not present

## 2018-05-09 LAB — COLOGUARD: Cologuard: NEGATIVE

## 2018-05-13 ENCOUNTER — Ambulatory Visit
Admission: RE | Admit: 2018-05-13 | Discharge: 2018-05-13 | Disposition: A | Discharge status: Home / Self Care | Payer: Medicare Other | Source: Ambulatory Visit | Attending: Family Medicine | Admitting: Family Medicine

## 2018-05-13 DIAGNOSIS — Z1231 Encounter for screening mammogram for malignant neoplasm of breast: Secondary | ICD-10-CM | POA: Diagnosis not present

## 2018-06-09 ENCOUNTER — Other Ambulatory Visit: Payer: Self-pay | Admitting: Family Medicine

## 2018-06-09 DIAGNOSIS — E7800 Pure hypercholesterolemia, unspecified: Secondary | ICD-10-CM

## 2018-08-26 ENCOUNTER — Encounter: Payer: Self-pay | Admitting: Family Medicine

## 2018-08-26 ENCOUNTER — Ambulatory Visit (INDEPENDENT_AMBULATORY_CARE_PROVIDER_SITE_OTHER): Payer: Medicare Other | Admitting: Family Medicine

## 2018-08-26 VITALS — BP 130/80 | HR 94 | Temp 98.4°F | Resp 16 | Wt 151.4 lb

## 2018-08-26 DIAGNOSIS — J069 Acute upper respiratory infection, unspecified: Secondary | ICD-10-CM | POA: Diagnosis not present

## 2018-08-26 NOTE — Progress Notes (Signed)
  Subjective:     Patient ID: Caitlyn Baker, female   DOB: [birth year], 67 y.o.   MRN: [id number] Chief Complaint  Patient presents with  . Sore Throat    Patient comes in office today with complaints of a sore throat since 08/20/18. Patient reports symptoms initially started off as a scratchy throat but sinus drainage started to cause her to have a dry cough. Patient states that she has taken otc Ibuprofen, Tylenol and throat lozenges.    HPI No fever or chills. States she takes care of her 69 year old grandson.   Review of Systems     Objective:   Physical Exam  Constitutional: She appears well-developed and well-nourished. She does not appear ill. No distress.  Ears: T.M's intact without inflammation Throat: no tonsillar enlargement or exudate Neck: no cervical adenopathy Lungs: clear     Assessment:    1. URI, acute    Plan:    Discussed use of Mucinex D for congestion, Delsym for cough, and Benadryl for postnasal drainage. Instructed to call if cough not improving or worsening over the next week.

## 2018-08-26 NOTE — Patient Instructions (Signed)
Discussed use of Mucinex D for congestion, Delsym for cough, and Benadryl for postnasal drainage 

## 2018-09-01 DIAGNOSIS — Z23 Encounter for immunization: Secondary | ICD-10-CM | POA: Diagnosis not present

## 2019-04-06 ENCOUNTER — Ambulatory Visit: Payer: Medicare Other

## 2019-04-07 ENCOUNTER — Ambulatory Visit: Payer: Medicare Other

## 2019-04-12 ENCOUNTER — Ambulatory Visit (INDEPENDENT_AMBULATORY_CARE_PROVIDER_SITE_OTHER): Payer: Medicare Other

## 2019-04-12 ENCOUNTER — Other Ambulatory Visit: Payer: Self-pay

## 2019-04-12 DIAGNOSIS — Z0000 Encounter for general adult medical examination without abnormal findings: Secondary | ICD-10-CM | POA: Diagnosis not present

## 2019-04-12 DIAGNOSIS — E2839 Other primary ovarian failure: Secondary | ICD-10-CM

## 2019-04-12 NOTE — Patient Instructions (Signed)
Caitlyn Baker , Thank you for taking time to come for your Medicare Wellness Visit. I appreciate your ongoing commitment to your health goals. Please review the following plan we discussed and let me know if I can assist you in the future.   Screening recommendations/referrals: Colonoscopy: Cologuard due 05/09/2021 Mammogram: Up to date, due 05/2020 Bone Density: Ordered today. Pt aware office will contact her to set up apt.  Recommended yearly ophthalmology/optometry visit for glaucoma screening and checkup Recommended yearly dental visit for hygiene and checkup  Vaccinations: Influenza vaccine: Up to date Pneumococcal vaccine: Completed series Tdap vaccine: Up to date, due 10/2024 Shingles vaccine: Pt declines today.     Advanced directives: Please bring a copy of your POA (Power of Attorney) and/or Living Will to your next appointment.   Conditions/risks identified: Continue to increase water intake to 6-8 8 oz glasses a day.   Next appointment: 04/21/19 @ 3:20 with Dr Caryn Section.    Preventive Care 38 Years and Older, Female Preventive care refers to lifestyle choices and visits with your health care provider that can promote health and wellness. What does preventive care include?  A yearly physical exam. This is also called an annual well check.  Dental exams once or twice a year.  Routine eye exams. Ask your health care provider how often you should have your eyes checked.  Personal lifestyle choices, including:  Daily care of your teeth and gums.  Regular physical activity.  Eating a healthy diet.  Avoiding tobacco and drug use.  Limiting alcohol use.  Practicing safe sex.  Taking low-dose aspirin every day.  Taking vitamin and mineral supplements as recommended by your health care provider. What happens during an annual well check? The services and screenings done by your health care provider during your annual well check will depend on your age, overall health,  lifestyle risk factors, and family history of disease. Counseling  Your health care provider may ask you questions about your:  Alcohol use.  Tobacco use.  Drug use.  Emotional well-being.  Home and relationship well-being.  Sexual activity.  Eating habits.  History of falls.  Memory and ability to understand (cognition).  Work and work Statistician.  Reproductive health. Screening  You may have the following tests or measurements:  Height, weight, and BMI.  Blood pressure.  Lipid and cholesterol levels. These may be checked every 5 years, or more frequently if you are over 69 years old.  Skin check.  Lung cancer screening. You may have this screening every year starting at age 38 if you have a 30-pack-year history of smoking and currently smoke or have quit within the past 15 years.  Fecal occult blood test (FOBT) of the stool. You may have this test every year starting at age 48.  Flexible sigmoidoscopy or colonoscopy. You may have a sigmoidoscopy every 5 years or a colonoscopy every 10 years starting at age 79.  Hepatitis C blood test.  Hepatitis B blood test.  Sexually transmitted disease (STD) testing.  Diabetes screening. This is done by checking your blood sugar (glucose) after you have not eaten for a while (fasting). You may have this done every 1-3 years.  Bone density scan. This is done to screen for osteoporosis. You may have this done starting at age 64.  Mammogram. This may be done every 1-2 years. Talk to your health care provider about how often you should have regular mammograms. Talk with your health care provider about your test results, treatment options,  and if necessary, the need for more tests. Vaccines  Your health care provider may recommend certain vaccines, such as:  Influenza vaccine. This is recommended every year.  Tetanus, diphtheria, and acellular pertussis (Tdap, Td) vaccine. You may need a Td booster every 10 years.  Zoster  vaccine. You may need this after age 46.  Pneumococcal 13-valent conjugate (PCV13) vaccine. One dose is recommended after age 79.  Pneumococcal polysaccharide (PPSV23) vaccine. One dose is recommended after age 4. Talk to your health care provider about which screenings and vaccines you need and how often you need them. This information is not intended to replace advice given to you by your health care provider. Make sure you discuss any questions you have with your health care provider. Document Released: 11/15/2015 Document Revised: 07/08/2016 Document Reviewed: 08/20/2015 Elsevier Interactive Patient Education  2017 [place] Prevention in the Home Falls can cause injuries. They can happen to people of all ages. There are many things you can do to make your home safe and to help prevent falls. What can I do on the outside of my home?  Regularly fix the edges of walkways and driveways and fix any cracks.  Remove anything that might make you trip as you walk through a door, such as a raised step or threshold.  Trim any bushes or trees on the path to your home.  Use bright outdoor lighting.  Clear any walking paths of anything that might make someone trip, such as rocks or tools.  Regularly check to see if handrails are loose or broken. Make sure that both sides of any steps have handrails.  Any raised decks and porches should have guardrails on the edges.  Have any leaves, snow, or ice cleared regularly.  Use sand or salt on walking paths during winter.  Clean up any spills in your garage right away. This includes oil or grease spills. What can I do in the bathroom?  Use night lights.  Install grab bars by the toilet and in the tub and shower. Do not use towel bars as grab bars.  Use non-skid mats or decals in the tub or shower.  If you need to sit down in the shower, use a plastic, non-slip stool.  Keep the floor dry. Clean up any water that spills on the  floor as soon as it happens.  Remove soap buildup in the tub or shower regularly.  Attach bath mats securely with double-sided non-slip rug tape.  Do not have throw rugs and other things on the floor that can make you trip. What can I do in the bedroom?  Use night lights.  Make sure that you have a light by your bed that is easy to reach.  Do not use any sheets or blankets that are too big for your bed. They should not hang down onto the floor.  Have a firm chair that has side arms. You can use this for support while you get dressed.  Do not have throw rugs and other things on the floor that can make you trip. What can I do in the kitchen?  Clean up any spills right away.  Avoid walking on wet floors.  Keep items that you use a lot in easy-to-reach places.  If you need to reach something above you, use a strong step stool that has a grab bar.  Keep electrical cords out of the way.  Do not use floor polish or wax that makes floors slippery. If  you must use wax, use non-skid floor wax.  Do not have throw rugs and other things on the floor that can make you trip. What can I do with my stairs?  Do not leave any items on the stairs.  Make sure that there are handrails on both sides of the stairs and use them. Fix handrails that are broken or loose. Make sure that handrails are as long as the stairways.  Check any carpeting to make sure that it is firmly attached to the stairs. Fix any carpet that is loose or worn.  Avoid having throw rugs at the top or bottom of the stairs. If you do have throw rugs, attach them to the floor with carpet tape.  Make sure that you have a light switch at the top of the stairs and the bottom of the stairs. If you do not have them, ask someone to add them for you. What else can I do to help prevent falls?  Wear shoes that:  Do not have high heels.  Have rubber bottoms.  Are comfortable and fit you well.  Are closed at the toe. Do not wear  sandals.  If you use a stepladder:  Make sure that it is fully opened. Do not climb a closed stepladder.  Make sure that both sides of the stepladder are locked into place.  Ask someone to hold it for you, if possible.  Clearly mark and make sure that you can see:  Any grab bars or handrails.  First and last steps.  Where the edge of each step is.  Use tools that help you move around (mobility aids) if they are needed. These include:  Canes.  Walkers.  Scooters.  Crutches.  Turn on the lights when you go into a dark area. Replace any light bulbs as soon as they burn out.  Set up your furniture so you have a clear path. Avoid moving your furniture around.  If any of your floors are uneven, fix them.  If there are any pets around you, be aware of where they are.  Review your medicines with your doctor. Some medicines can make you feel dizzy. This can increase your chance of falling. Ask your doctor what other things that you can do to help prevent falls. This information is not intended to replace advice given to you by your health care provider. Make sure you discuss any questions you have with your health care provider. Document Released: 08/15/2009 Document Revised: 03/26/2016 Document Reviewed: 11/23/2014 Elsevier Interactive Patient Education  2017 [employer].

## 2019-04-12 NOTE — Progress Notes (Signed)
Subjective:   Caitlyn Baker is a 68 y.o. female who presents for Medicare Annual (Subsequent) preventive examination.    This visit is being conducted through telemedicine due to the COVID-19 pandemic. This patient has given me verbal consent via doximity to conduct this visit, patient states they are participating from their home address. Some vital signs may be absent or patient reported.    Patient identification: identified by name, DOB, and current address  Review of Systems:  N/A  Cardiac Risk Factors include: advanced age (>93men, >52 women);dyslipidemia     Objective:     Vitals: There were no vitals taken for this visit.  There is no height or weight on file to calculate BMI. Unable to obtain vitals due to visit being conducted via telephonically.   Advanced Directives 04/12/2019 04/05/2018 03/03/2017 11/22/2015 11/22/2015  Does Patient Have a Medical Advance Directive? Yes Yes Yes Yes No  Type of Paramedic of [place];Living will [place];Living will Living will [place];Living will -  Does patient want to make changes to medical advance directive? - - No - Patient declined - -  Copy of [place] in Chart? No - copy requested No - copy requested - - -    Tobacco Social History   Tobacco Use  Smoking Status Never Smoker  Smokeless Tobacco Never Used     Counseling given: Not Answered   Clinical Intake:  Pre-visit preparation completed: Yes  Pain : No/denies pain Pain Score: 0-No pain     Nutritional Status: BMI 25 -29 Overweight Nutritional Risks: Non-healing wound(wound on left index finger- apt scheduled ) Diabetes: No  How often do you need to have someone help you when you read instructions, pamphlets, or other written materials from your doctor or pharmacy?: 1 - Never  Interpreter Needed?: No  Information entered by :: [facility], LPN  Past Medical History:  Diagnosis  Date  . Hyperlipidemia    Past Surgical History:  Procedure Laterality Date  . MOUTH SURGERY     Family History  Problem Relation Age of Onset  . Hypertension Mother   . Breast cancer Neg Hx    Social History   Socioeconomic History  . Marital status: Married    Spouse name: Not on file  . Number of children: 3  . Years of education: Not on file  . Highest education level: Some college, no degree  Occupational History  . Occupation: retired  Scientific laboratory technician  . Financial resource strain: Not hard at all  . Food insecurity:    Worry: Never true    Inability: Never true  . Transportation needs:    Medical: No    Non-medical: No  Tobacco Use  . Smoking status: Never Smoker  . Smokeless tobacco: Never Used  Substance and Sexual Activity  . Alcohol use: No  . Drug use: No  . Sexual activity: Not on file  Lifestyle  . Physical activity:    Days per week: 0 days    Minutes per session: 0 min  . Stress: Not at all  Relationships  . Social connections:    Talks on phone: Patient refused    Gets together: Patient refused    Attends religious service: Patient refused    Active member of club or organization: Patient refused    Attends meetings of clubs or organizations: Patient refused    Relationship status: Patient refused  Other Topics Concern  . Not on  file  Social History Narrative  . Not on file    Outpatient Encounter Medications as of 04/12/2019  Medication Sig  . aspirin 81 MG tablet Take 81 mg by mouth daily.  . Calcium Carbonate-Vitamin D 600-400 MG-UNIT tablet Take 1 tablet by mouth daily.   . Cholecalciferol (VITAMIN D PO) Take 1 tablet by mouth daily.  . Coenzyme Q10 (COQ10 PO) Take by mouth daily.   Marland Kitchen lovastatin (MEVACOR) 20 MG tablet TAKE 1 TABLET BY MOUTH EVERY DAY  . MULTIPLE VITAMIN PO Take by mouth daily.    No facility-administered encounter medications on file as of 04/12/2019.     Activities of Daily Living In your present state of health, do  you have any difficulty performing the following activities: 04/12/2019  Hearing? N  Vision? N  Comment Wears eye glasses daily.   Difficulty concentrating or making decisions? N  Walking or climbing stairs? N  Dressing or bathing? N  Doing errands, shopping? N  Preparing Food and eating ? N  Using the Toilet? N  In the past six months, have you accidently leaked urine? N  Do you have problems with loss of bowel control? N  Managing your Medications? N  Managing your Finances? N  Housekeeping or managing your Housekeeping? N  Some recent data might be hidden    Patient Care Team: Birdie Sons, MD as PCP - General (Family Medicine)    Assessment:   This is a routine wellness examination for [place].  Exercise Activities and Dietary recommendations Current Exercise Habits: Home exercise routine, Type of exercise: walking, Time (Minutes): 20, Frequency (Times/Week): 4, Weekly Exercise (Minutes/Week): 80, Intensity: Mild, Exercise limited by: None identified  Goals    . DIET - INCREASE WATER INTAKE     Recommend increasing water intake to 5-6 glasses a day.        Fall Risk: Fall Risk  04/12/2019 04/05/2018 03/03/2017 11/22/2015  Falls in the past year? 0 No No No    FALL RISK PREVENTION PERTAINING TO THE HOME:  Any stairs in or around the home? Yes  If so, are there any without handrails? No   Home free of loose throw rugs in walkways, pet beds, electrical cords, etc? Yes  Adequate lighting in your home to reduce risk of falls? Yes   ASSISTIVE DEVICES UTILIZED TO PREVENT FALLS:  Life alert? No  Use of a cane, walker or w/c? No  Grab bars in the bathroom? No  Shower chair or bench in shower? Yes  Elevated toilet seat or a handicapped toilet? No    TIMED UP AND GO:  Was the test performed? No .    Depression Screen PHQ 2/9 Scores 04/12/2019 04/05/2018 03/03/2017 11/22/2015  PHQ - 2 Score 0 0 0 0  PHQ- 9 Score - - 4 -     Cognitive Function     6CIT Screen  04/12/2019 04/05/2018  What Year? 0 points 0 points  What month? 0 points 0 points  What time? 0 points 0 points  Count back from 20 0 points 0 points  Months in reverse 0 points 0 points  Repeat phrase 0 points 0 points  Total Score 0 0    Immunization History  Administered Date(s) Administered  . Influenza Split 07/19/2010  . Influenza,inj,Quad PF,6+ Mos 07/31/2013  . Influenza-Unspecified 09/04/2015, 08/16/2017, 09/01/2018  . Pneumococcal Conjugate-13 11/22/2015  . Pneumococcal Polysaccharide-23 03/03/2017  . Td 08/06/2004, 10/12/2014  . Tdap 10/12/2014    Qualifies for Shingles  Vaccine? Yes . Due for Shingrix. Education has been provided regarding the importance of this vaccine. Pt has been advised to call insurance company to determine out of pocket expense. Advised may also receive vaccine at local pharmacy or Health Dept. Verbalized acceptance and understanding.  Tdap: Up to date  Flu Vaccine: Up to date  Pneumococcal Vaccine: Completed series  Screening Tests Health Maintenance  Topic Date Due  . DEXA SCAN  12/11/2018  . INFLUENZA VACCINE  06/03/2019  . MAMMOGRAM  05/13/2020  . Fecal DNA (Cologuard)  05/09/2021  . TETANUS/TDAP  10/12/2024  . Hepatitis C Screening  Completed  . PNA vac Low Risk Adult  Completed    Cancer Screenings:  Colorectal Screening: Cologuard completed 05/19/18. Repeat every 3 years.  Mammogram: Completed 05/13/18.   Bone Density: Completed 12/12/15. Results reflect OSTEOPENIA. Repeat every 3 years. Ordered today. Pt aware office will contact her to set up apt.   Lung Cancer Screening: (Low Dose CT Chest recommended if Age 71-80 years, 30 pack-year currently smoking OR have quit w/in 15years.) does not qualify.   Additional Screening:  Hepatitis C Screening: Up to date  Vision Screening: Recommended annual ophthalmology exams for early detection of glaucoma and other disorders of the eye.  Dental Screening: Recommended annual dental  exams for proper oral hygiene  Community Resource Referral:  CRR required this visit?  No       Plan:  I have personally reviewed and addressed the Medicare Annual Wellness questionnaire and have noted the following in the patient's chart:  A. Medical and social history B. Use of alcohol, tobacco or illicit drugs  C. Current medications and supplements D. Functional ability and status E.  Nutritional status F.  Physical activity G. Advance directives H. List of other physicians I.  Hospitalizations, surgeries, and ER visits in previous 12 months J.  [place] such as hearing and vision if needed, cognitive and depression L. Referrals and appointments   In addition, I have reviewed and discussed with patient certain preventive protocols, quality metrics, and best practice recommendations. A written personalized care plan for preventive services as well as general preventive health recommendations were provided to patient. Nurse Health Advisor  Signed,    Ladeidra Borys [place], [place]  6/[id number]/9935 Nurse Health Advisor   Nurse Notes: Scheduled a f/u apt for 04/21/19 to examine finger. Open wound on left index finger not healing.

## 2019-04-14 ENCOUNTER — Ambulatory Visit: Payer: Medicare Other

## 2019-04-21 ENCOUNTER — Other Ambulatory Visit: Payer: Self-pay

## 2019-04-21 ENCOUNTER — Encounter: Payer: Self-pay | Admitting: Family Medicine

## 2019-04-21 ENCOUNTER — Ambulatory Visit (INDEPENDENT_AMBULATORY_CARE_PROVIDER_SITE_OTHER): Payer: Medicare Other | Admitting: Family Medicine

## 2019-04-21 VITALS — BP 132/70 | HR 84 | Temp 98.6°F | Resp 16 | Ht 64.0 in | Wt 152.0 lb

## 2019-04-21 DIAGNOSIS — L03012 Cellulitis of left finger: Secondary | ICD-10-CM | POA: Diagnosis not present

## 2019-04-21 DIAGNOSIS — L989 Disorder of the skin and subcutaneous tissue, unspecified: Secondary | ICD-10-CM | POA: Diagnosis not present

## 2019-04-21 MED ORDER — CIPROFLOXACIN HCL 500 MG PO TABS: 500.0000 mg | ORAL_TABLET | Freq: Two times a day (BID) | ORAL | 0 refills | Status: AC

## 2019-04-21 NOTE — Patient Instructions (Signed)
.   Please review the attached list of medications and notify my office if there are any errors.   . Please bring all of your medications to every appointment so we can make sure that our medication list is the same as yours.   

## 2019-04-21 NOTE — Progress Notes (Signed)
       Patient: Caitlyn Baker Female    DOB: 12-07-1950   68 y.o.   MRN: [id number] Visit Date: 04/21/2019  Today's Provider: Lelon Huh, MD   Chief Complaint  Patient presents with  . Cyst   Subjective:   HPI  Patient comes in today c/o cyst on her left index finger for about 2 months. She reports that this came on all of a sudden. Her cyst has not changed in size, but it seems to change in color. She has mild tenderness.  She has only put neosporin on the affected area.   Allergies  Allergen Reactions  . Codeine Nausea Only and Nausea And Vomiting  . Prednisone     Other reaction(s): Chest pain Severe anxiety and depression.     Current Outpatient Medications:  .  aspirin 81 MG tablet, Take 81 mg by mouth daily., Disp: , Rfl:  .  Calcium Carbonate-Vitamin D 600-400 MG-UNIT tablet, Take 1 tablet by mouth daily. , Disp: , Rfl:  .  Cholecalciferol (VITAMIN D PO), Take 1 tablet by mouth daily., Disp: , Rfl:  .  Coenzyme Q10 (COQ10 PO), Take by mouth daily. , Disp: , Rfl:  .  lovastatin (MEVACOR) 20 MG tablet, TAKE 1 TABLET BY MOUTH EVERY DAY, Disp: 90 tablet, Rfl: 4 .  MULTIPLE VITAMIN PO, Take by mouth daily. , Disp: , Rfl:   Review of Systems  Constitutional: Negative for activity change, appetite change, chills, diaphoresis, fatigue, fever and unexpected weight change.  Musculoskeletal: Negative for arthralgias and joint swelling.  Skin: Positive for color change and wound. Negative for pallor and rash.    Social History   Tobacco Use  . Smoking status: Never Smoker  . Smokeless tobacco: Never Used  Substance Use Topics  . Alcohol use: No      Objective:   BP 132/70 (BP Location: Left Arm, Patient Position: Sitting, Cuff Size: Normal)   Pulse 84   Temp 98.6 F (37 C)   Resp 16   Ht 5\' 4"  (1.626 m)   Wt 152 lb (68.9 kg)   SpO2 96%   BMI 26.09 kg/m  Vitals:   04/21/19 1524  BP: 132/70  Pulse: 84  Resp: 16  Temp: 98.6 F (37 C)  SpO2: 96%  Weight:  152 lb (68.9 kg)  Height: 5\' 4"  (1.626 m)     Physical Exam  See photo    Assessment & Plan     1. Cellulitis of finger of left hand  - ciprofloxacin (CIPRO) 500 MG tablet; Take 1 tablet (500 mg total) by mouth 2 (two) times daily for 10 days.  Dispense: 20 tablet; Refill: 0  2. Skin lesion  - ciprofloxacin (CIPRO) 500 MG tablet; Take 1 tablet (500 mg total) by mouth 2 (two) times daily for 10 days.  Dispense: 20 tablet; Refill: 0  Will check with here after finishing antibiotic. Consider dermatology referral if lesion lot healing after infection cleared.   The entirety of the information documented in the History of Present Illness, Review of Systems and Physical Exam were personally obtained by me. Portions of this information were initially documented by Ashley Royalty, CMA and reviewed by me for thoroughness and accuracy.      Lelon Huh, MD  [facility]

## 2019-05-25 ENCOUNTER — Telehealth: Payer: Self-pay

## 2019-05-25 ENCOUNTER — Encounter: Payer: Self-pay | Admitting: Family Medicine

## 2019-05-25 ENCOUNTER — Ambulatory Visit
Admission: RE | Admit: 2019-05-25 | Discharge: 2019-05-25 | Disposition: A | Discharge status: Home / Self Care | Payer: Medicare Other | Source: Ambulatory Visit | Attending: Family Medicine | Admitting: Family Medicine

## 2019-05-25 DIAGNOSIS — M85852 Other specified disorders of bone density and structure, left thigh: Secondary | ICD-10-CM | POA: Diagnosis not present

## 2019-05-25 DIAGNOSIS — Z780 Asymptomatic menopausal state: Secondary | ICD-10-CM | POA: Diagnosis not present

## 2019-05-25 DIAGNOSIS — E2839 Other primary ovarian failure: Secondary | ICD-10-CM

## 2019-05-25 DIAGNOSIS — M810 Age-related osteoporosis without current pathological fracture: Secondary | ICD-10-CM | POA: Diagnosis not present

## 2019-05-25 MED ORDER — ALENDRONATE SODIUM 70 MG PO TABS: 70.0000 mg | ORAL_TABLET | ORAL | 11 refills | Status: DC

## 2019-05-25 NOTE — Telephone Encounter (Signed)
Pt advised.  RX sent to [facility] in [place].    Thanks,   -Mickel Baas

## 2019-05-25 NOTE — Telephone Encounter (Signed)
-----   Message from Birdie Sons, MD sent at 05/25/2019  3:48 PM EDT ----- Bone density is much worse, now has osteoporosis. Need to start alendronate 70mg  once every week, #4, rf x 11. Call if any problems from medication.

## 2019-06-28 ENCOUNTER — Ambulatory Visit (INDEPENDENT_AMBULATORY_CARE_PROVIDER_SITE_OTHER): Payer: Medicare Other | Admitting: Family Medicine

## 2019-06-28 ENCOUNTER — Encounter: Payer: Self-pay | Admitting: Family Medicine

## 2019-06-28 ENCOUNTER — Other Ambulatory Visit: Payer: Self-pay

## 2019-06-28 VITALS — BP 122/70 | HR 83 | Temp 97.3°F | Resp 16 | Ht 64.0 in | Wt 153.4 lb

## 2019-06-28 DIAGNOSIS — Z23 Encounter for immunization: Secondary | ICD-10-CM | POA: Diagnosis not present

## 2019-06-28 DIAGNOSIS — M810 Age-related osteoporosis without current pathological fracture: Secondary | ICD-10-CM | POA: Diagnosis not present

## 2019-06-28 DIAGNOSIS — E559 Vitamin D deficiency, unspecified: Secondary | ICD-10-CM

## 2019-06-28 DIAGNOSIS — E7800 Pure hypercholesterolemia, unspecified: Secondary | ICD-10-CM | POA: Diagnosis not present

## 2019-06-28 NOTE — Patient Instructions (Addendum)
.   Please review the attached list of medications and notify my office if there are any errors.   . Please bring all of your medications to every appointment so we can make sure that our medication list is the same as yours.   Please call the [facility] [phone]) to schedule a routine screening mammogram.   The CDC recommends two doses of Shingrix (the shingles vaccine) separated by 2 to 6 months for adults age 69 years and older. I recommend checking with your insurance plan regarding coverage for this vaccine.

## 2019-06-28 NOTE — Progress Notes (Signed)
Patient: Caitlyn Baker, Female    DOB: 09-19-1951, 68 y.o.   MRN: [id number] Visit Date: 06/28/2019  Today's Provider: Lelon Huh, MD   Chief Complaint  Patient presents with  . Breast Exam  . Follow-up   Subjective:    Follow up chronic medications conditions and breast exam She had her AWV with [facility] in June    Caitlyn Baker is a 68 y.o. female. She feels well. She reports exercising daily. She reports she is sleeping fairly well. 04/12/2019 AWV-with [facility] 07/16/2018 Pap/HPV-negative 05/22/2018 Mammogram-BI-RADS 1 12/24/2003 Colonoscopy-hemorrhoids 05/09/2018 Cologuard-negative ----------------------------------------------------------- She continue lovastatin for cholesterol which she is tolerating well She continue alendronate and supplemental vitamin d for osteroporosis which she reports taking consistently with no adverse effects.   Lipid Panel     Component Value Date/Time   CHOL 191 04/12/2018 0814   TRIG 117 04/12/2018 0814   HDL 59 04/12/2018 0814   CHOLHDL 3.2 04/12/2018 0814   LDLCALC 109 (H) 04/12/2018 0814   Lab Results  Component Value Date   VD25OH 32.6 04/12/2018   Lab Results  Component Value Date   VD25OH 32.6 04/12/2018     Social History   Socioeconomic History  . Marital status: Married    Spouse name: Not on file  . Number of children: 3  . Years of education: Not on file  . Highest education level: Some college, no degree  Occupational History  . Occupation: retired  Scientific laboratory technician  . Financial resource strain: Not hard at all  . Food insecurity    Worry: Never true    Inability: Never true  . Transportation needs    Medical: No    Non-medical: No  Tobacco Use  . Smoking status: Never Smoker  . Smokeless tobacco: Never Used  Substance and Sexual Activity  . Alcohol use: No  . Drug use: No  . Sexual activity: Not on file  Lifestyle  . Physical activity    Days per week: 0 days    Minutes per session: 0 min  .  Stress: Not at all  Relationships  . Social Herbalist on phone: Patient refused    Gets together: Patient refused    Attends religious service: Patient refused    Active member of club or organization: Patient refused    Attends meetings of clubs or organizations: Patient refused    Relationship status: Patient refused  . Intimate partner violence    Fear of current or ex partner: Patient refused    Emotionally abused: Patient refused    Physically abused: Patient refused    Forced sexual activity: Patient refused  Other Topics Concern  . Not on file  Social History Narrative  . Not on file    Past Medical History:  Diagnosis Date  . Hyperlipidemia      Patient Active Problem List   Diagnosis Date Noted  . Vitamin D deficiency 04/08/2018  . Allergic rhinitis 11/21/2015  . Anxiety 11/21/2015  . Back ache 11/21/2015  . Chest pain 11/21/2015  . Fatigue 11/21/2015  . Blood glucose elevated 11/21/2015  . Osteoporosis 11/21/2015  . Hand paresthesia 11/21/2015  . Hypercholesteremia 01/13/2007    Past Surgical History:  Procedure Laterality Date  . MOUTH SURGERY      Her family history includes Hypertension in her mother. There is no history of Breast cancer.   Current Outpatient Medications:  .  alendronate (FOSAMAX) 70 MG tablet, Take 1 tablet (  70 mg total) by mouth every 7 (seven) days. Take with a full glass of water on an empty stomach., Disp: 4 tablet, Rfl: 11 .  aspirin 81 MG tablet, Take 81 mg by mouth daily., Disp: , Rfl:  .  Calcium Carbonate-Vitamin D 600-400 MG-UNIT tablet, Take 1 tablet by mouth daily. , Disp: , Rfl:  .  Cholecalciferol (VITAMIN D PO), Take 1 tablet by mouth daily., Disp: , Rfl:  .  Coenzyme Q10 (COQ10 PO), Take by mouth daily. , Disp: , Rfl:  .  lovastatin (MEVACOR) 20 MG tablet, TAKE 1 TABLET BY MOUTH EVERY DAY, Disp: 90 tablet, Rfl: 4 .  MULTIPLE VITAMIN PO, Take by mouth daily. , Disp: , Rfl:   Patient Care Team: Birdie Sons, MD as PCP - General (Family Medicine)     Objective:    Vitals: BP 122/70 (BP Location: Left Arm, Patient Position: Sitting, Cuff Size: Normal)   Pulse 83   Temp (!) 97.3 F (36.3 C) (Temporal)   Resp 16   Ht 5\' 4"  (1.626 m)   Wt 153 lb 6.4 oz (69.6 kg)   SpO2 99%   BMI 26.33 kg/m   Physical Exam    General Appearance:    Alert, cooperative, no distress  Eyes:    PERRL, conjunctiva/corneas clear, EOM's intact       Lungs:     Clear to auscultation bilaterally, respirations unlabored  Heart:    Normal heart rate. Normal rhythm. No murmurs, rubs, or gallops.   Breast Exam   normal appearance, no masses or tenderness  MS:   All extremities are intact.   Neurologic:   Awake, alert, oriented x 3. No apparent focal neurological           defect.         Assessment & Plan:    1. Hypercholesteremia  - Lipid panel - Comprehensive metabolic panel  2. Vitamin D deficiency  - VITAMIN D 25 Hydroxy (Vit-D Deficiency, Fractures)  3. Age related osteoporosis, unspecified pathological fracture presence   4. Need for influenza vaccination  - Flu Vaccine QUAD High Dose(Fluad)   Lelon Huh, MD  [facility]

## 2019-06-29 ENCOUNTER — Encounter: Payer: Self-pay | Admitting: Family Medicine

## 2019-06-29 ENCOUNTER — Telehealth: Payer: Self-pay

## 2019-06-29 LAB — VITAMIN D 25 HYDROXY (VIT D DEFICIENCY, FRACTURES): Vit D, 25-Hydroxy: 27.7 ng/mL — ABNORMAL LOW (ref 30.0–100.0)

## 2019-06-29 LAB — COMPREHENSIVE METABOLIC PANEL
ALT: 14 IU/L (ref 0–32)
AST: 15 IU/L (ref 0–40)
Albumin/Globulin Ratio: 2 (ref 1.2–2.2)
Albumin: 4.5 g/dL (ref 3.8–4.8)
Alkaline Phosphatase: 75 IU/L (ref 39–117)
BUN/Creatinine Ratio: 23 (ref 12–28)
BUN: 15 mg/dL (ref 8–27)
Bilirubin Total: 0.4 mg/dL (ref 0.0–1.2)
CO2: 25 mmol/L (ref 20–29)
Calcium: 9.9 mg/dL (ref 8.7–10.3)
Chloride: 103 mmol/L (ref 96–106)
Creatinine, Ser: 0.66 mg/dL (ref 0.57–1.00)
GFR calc Af Amer: 104 mL/min/{1.73_m2} (ref >59)
GFR calc non Af Amer: 90 mL/min/{1.73_m2} (ref >59)
Globulin, Total: 2.3 g/dL (ref 1.5–4.5)
Glucose: 97 mg/dL (ref 65–99)
Potassium: 4.8 mmol/L (ref 3.5–5.2)
Sodium: 142 mmol/L (ref 134–144)
Total Protein: 6.8 g/dL (ref 6.0–8.5)

## 2019-06-29 LAB — LIPID PANEL
Chol/HDL Ratio: 3.6 ratio (ref 0.0–4.4)
Cholesterol, Total: 229 mg/dL — ABNORMAL HIGH (ref 100–199)
HDL: 63 mg/dL (ref >39)
LDL Calculated: 133 mg/dL — ABNORMAL HIGH (ref 0–99)
Triglycerides: 165 mg/dL — ABNORMAL HIGH (ref 0–149)
VLDL Cholesterol Cal: 33 mg/dL (ref 5–40)

## 2019-06-29 NOTE — Telephone Encounter (Signed)
Pt advised.   Thanks,   -Abygale Karpf  

## 2019-06-29 NOTE — Telephone Encounter (Signed)
-----   Message from Birdie Sons, MD sent at 06/29/2019  9:50 AM EDT ----- Cholesterol is up a bit to 229 from 191. Continue lovastatin and try to cut back on saturated fats in diet. Otherwise labs are good. Continue current medications.  Check yearly.

## 2019-07-05 ENCOUNTER — Other Ambulatory Visit: Payer: Self-pay | Admitting: Family Medicine

## 2019-07-05 DIAGNOSIS — Z1231 Encounter for screening mammogram for malignant neoplasm of breast: Secondary | ICD-10-CM

## 2019-08-24 ENCOUNTER — Ambulatory Visit
Admission: RE | Admit: 2019-08-24 | Discharge: 2019-08-24 | Disposition: A | Discharge status: Home / Self Care | Payer: Medicare Other | Source: Ambulatory Visit | Attending: Family Medicine | Admitting: Family Medicine

## 2019-08-24 DIAGNOSIS — Z1231 Encounter for screening mammogram for malignant neoplasm of breast: Secondary | ICD-10-CM | POA: Insufficient documentation

## 2019-09-06 ENCOUNTER — Other Ambulatory Visit: Payer: Self-pay | Admitting: Family Medicine

## 2019-09-06 DIAGNOSIS — E7800 Pure hypercholesterolemia, unspecified: Secondary | ICD-10-CM

## 2019-09-18 ENCOUNTER — Other Ambulatory Visit: Payer: Self-pay

## 2019-09-18 ENCOUNTER — Encounter: Payer: Self-pay | Admitting: Family Medicine

## 2019-09-18 ENCOUNTER — Ambulatory Visit (INDEPENDENT_AMBULATORY_CARE_PROVIDER_SITE_OTHER): Payer: Medicare Other | Admitting: Family Medicine

## 2019-09-18 VITALS — BP 158/82 | HR 101 | Temp 97.1°F | Wt 153.8 lb

## 2019-09-18 DIAGNOSIS — R002 Palpitations: Secondary | ICD-10-CM

## 2019-09-18 MED ORDER — METOPROLOL SUCCINATE ER 25 MG PO TB24: 12.5000 mg | ORAL_TABLET | Freq: Every day | ORAL | 1 refills | Status: DC

## 2019-09-18 NOTE — Progress Notes (Signed)
Patient: Caitlyn Baker Female    DOB: Jun 16, 1951   68 y.o.   MRN: [id number] Visit Date: 09/18/2019  Today's Provider: Lelon Huh, MD   Chief Complaint  Patient presents with  . Palpitations   Subjective:     Palpitations  This is a new problem. Episode onset: Saturday. The problem occurs intermittently. The problem has been unchanged. Nothing aggravates the symptoms. Pertinent negatives include no chest pain, dizziness, fever, nausea, shortness of breath, vomiting or weakness. Risk factors include dyslipidemia, family history and post menopause. Her past medical history is significant for anxiety.  States it just started over the weekend. Feels like a skipped bit followed by heart is beating hard lasting a few minutes up to 10 minutes.  No new medications or supplements.  Allergies  Allergen Reactions  . Codeine Nausea Only and Nausea And Vomiting  . Prednisone     Other reaction(s): Chest pain Severe anxiety and depression.     Current Outpatient Medications:  .  alendronate (FOSAMAX) 70 MG tablet, Take 1 tablet (70 mg total) by mouth every 7 (seven) days. Take with a full glass of water on an empty stomach., Disp: 4 tablet, Rfl: 11 .  aspirin 81 MG tablet, Take 81 mg by mouth daily., Disp: , Rfl:  .  Calcium Carbonate-Vitamin D 600-400 MG-UNIT tablet, Take 1 tablet by mouth daily. , Disp: , Rfl:  .  Cholecalciferol (VITAMIN D PO), Take 1 tablet by mouth daily., Disp: , Rfl:  .  Coenzyme Q10 (COQ10 PO), Take by mouth daily. , Disp: , Rfl:  .  lovastatin (MEVACOR) 20 MG tablet, TAKE 1 TABLET BY MOUTH EVERY DAY, Disp: 90 tablet, Rfl: 4 .  MULTIPLE VITAMIN PO, Take by mouth daily. , Disp: , Rfl:   Review of Systems  Constitutional: Negative for appetite change, chills, fatigue and fever.  Respiratory: Negative for chest tightness and shortness of breath.   Cardiovascular: Positive for palpitations. Negative for chest pain.  Gastrointestinal: Negative for abdominal  pain, nausea and vomiting.  Neurological: Negative for dizziness and weakness.    Social History   Tobacco Use  . Smoking status: Never Smoker  . Smokeless tobacco: Never Used  Substance Use Topics  . Alcohol use: No      Objective:   BP (!) 158/82 (BP Location: Right Arm, Patient Position: Sitting, Cuff Size: Normal)   Pulse (!) 101   Temp (!) 97.1 F (36.2 C) (Temporal)   Wt 153 lb 12.8 oz (69.8 kg)   SpO2 98%   BMI 26.40 kg/m  Vitals:   09/18/19 1526  BP: (!) 158/82  Pulse: (!) 101  Temp: (!) 97.1 F (36.2 C)  TempSrc: Temporal  SpO2: 98%  Weight: 153 lb 12.8 oz (69.8 kg)  Body mass index is 26.4 kg/m.   Physical Exam   General Appearance:    Well developed, well nourished female in no acute distress  Eyes:    PERRL, conjunctiva/corneas clear, EOM's intact       Lungs:     Clear to auscultation bilaterally, respirations unlabored  Heart:    Tachycardic. Normal rhythm. No murmurs, rubs, or gallops.   MS:   All extremities are intact.   Neurologic:   Awake, alert, oriented x 3. No apparent focal neurological           defect.           Assessment & Plan     1. Palpitations  -  EKG 12-Lead - Renal function panel - Magnesium - TSH - Holter monitor - 24 hour; Future - metoprolol succinate (TOPROL-XL) 25 MG 24 hr tablet; Take 0.5 tablets (12.5 mg total) by mouth daily.  Dispense: 30 tablet; Refill: 1 - CBC    The entirety of the information documented in the History of Present Illness, Review of Systems and Physical Exam were personally obtained by me. Portions of this information were initially documented by Meyer Cory, CMA and reviewed by me for thoroughness and accuracy.      Lelon Huh, MD  [facility]

## 2019-09-19 LAB — CBC
Hematocrit: 38.1 % (ref 34.0–46.6)
Hemoglobin: 13 g/dL (ref 11.1–15.9)
MCH: 31.5 pg (ref 26.6–33.0)
MCHC: 34.1 g/dL (ref 31.5–35.7)
MCV: 92 fL (ref 79–97)
Platelets: 332 10*3/uL (ref 150–450)
RBC: 4.13 x10E6/uL (ref 3.77–5.28)
RDW: 11.8 % (ref 11.7–15.4)
WBC: 7.8 10*3/uL (ref 3.4–10.8)

## 2019-09-19 LAB — RENAL FUNCTION PANEL
Albumin: 4.6 g/dL (ref 3.8–4.8)
BUN/Creatinine Ratio: 15 (ref 12–28)
BUN: 11 mg/dL (ref 8–27)
CO2: 24 mmol/L (ref 20–29)
Calcium: 10.8 mg/dL — ABNORMAL HIGH (ref 8.7–10.3)
Chloride: 104 mmol/L (ref 96–106)
Creatinine, Ser: 0.71 mg/dL (ref 0.57–1.00)
GFR calc Af Amer: 100 mL/min/{1.73_m2} (ref >59)
GFR calc non Af Amer: 87 mL/min/{1.73_m2} (ref >59)
Glucose: 116 mg/dL — ABNORMAL HIGH (ref 65–99)
Phosphorus: 2.3 mg/dL — ABNORMAL LOW (ref 3.0–4.3)
Potassium: 4.3 mmol/L (ref 3.5–5.2)
Sodium: 141 mmol/L (ref 134–144)

## 2019-09-19 LAB — TSH: TSH: 1.44 u[IU]/mL (ref 0.450–4.500)

## 2019-09-19 LAB — MAGNESIUM: Magnesium: 2 mg/dL (ref 1.6–2.3)

## 2019-09-25 LAB — SPECIMEN STATUS REPORT

## 2019-09-26 ENCOUNTER — Other Ambulatory Visit: Payer: Self-pay

## 2019-09-26 ENCOUNTER — Ambulatory Visit
Admission: RE | Admit: 2019-09-26 | Discharge: 2019-09-26 | Disposition: A | Discharge status: Home / Self Care | Payer: Medicare Other | Source: Ambulatory Visit | Attending: Family Medicine | Admitting: Family Medicine

## 2019-09-26 DIAGNOSIS — R002 Palpitations: Secondary | ICD-10-CM

## 2019-10-03 ENCOUNTER — Telehealth: Payer: Self-pay | Admitting: Family Medicine

## 2019-10-03 DIAGNOSIS — I493 Ventricular premature depolarization: Secondary | ICD-10-CM

## 2019-10-03 DIAGNOSIS — R002 Palpitations: Secondary | ICD-10-CM

## 2019-10-03 HISTORY — DX: Ventricular premature depolarization: I49.3

## 2019-10-03 HISTORY — DX: Palpitations: R00.2

## 2019-10-03 NOTE — Telephone Encounter (Signed)
Holter monitor shows frequent premature ventricular contractions which cause palpitations, but are otherwise completely harmless. The metoprolol should calm these down. Continue metoprolol and schedule follow up in 2-3 weeks.

## 2019-10-03 NOTE — Telephone Encounter (Signed)
Patient advised. Follow up scheduled.  

## 2019-10-16 DIAGNOSIS — R002 Palpitations: Secondary | ICD-10-CM | POA: Insufficient documentation

## 2019-10-24 ENCOUNTER — Ambulatory Visit: Payer: Medicare Other | Admitting: Family Medicine

## 2019-11-20 NOTE — Progress Notes (Signed)
Patient: Caitlyn Baker Female    DOB: [birth year]   69 y.o.   MRN: [id number] Visit Date: 11/21/2019  Today's Provider: Lelon Huh, MD   Chief Complaint  Patient presents with  . Follow-up  . Palpitations   Subjective:     Palpitations  The current episode started more than 1 month ago. The problem occurs intermittently. The problem has been gradually improving. The symptoms are aggravated by unknown. Associated symptoms include an irregular heartbeat. Pertinent negatives include no chest pain, dizziness, nausea, numbness or shortness of breath. She has tried beta blockers for the symptoms.  Was seen for this on 09/18/2019 with unremarkable labs. Holter monitor 1235 PVC/s over 24 hours. She has since started on 12.5mg  metoprolol daily and reports significant improvement in palpations. Still has a few episodes each week that last a few minutes then resolve spontaneously. Is tolerating metoprolol well. Occasionally notices bp getting down to the 90s at night, but no associated symptoms.   Also has nodule on left 4th finger for several months. Was initially red and sore and treated with antibiotic. Is now not sore or painful at all, but nodule is still there.    Allergies  Allergen Reactions  . Codeine Nausea Only and Nausea And Vomiting  . Prednisone Other (See Comments)    Other reaction(s): Chest pain Severe anxiety and depression. Suicidal Severe anxiety and depression.     Current Outpatient Medications:  .  alendronate (FOSAMAX) 70 MG tablet, Take 1 tablet (70 mg total) by mouth every 7 (seven) days. Take with a full glass of water on an empty stomach., Disp: 4 tablet, Rfl: 11 .  aspirin 81 MG tablet, Take 81 mg by mouth daily., Disp: , Rfl:  .  Calcium Carbonate-Vitamin D 600-400 MG-UNIT tablet, Take 1 tablet by mouth daily. , Disp: , Rfl:  .  Cholecalciferol (VITAMIN D PO), Take 1 tablet by mouth daily., Disp: , Rfl:  .  Coenzyme Q10 (COQ10 PO), Take by mouth daily.  , Disp: , Rfl:  .  lovastatin (MEVACOR) 20 MG tablet, TAKE 1 TABLET BY MOUTH EVERY DAY, Disp: 90 tablet, Rfl: 4 .  metoprolol succinate (TOPROL-XL) 25 MG 24 hr tablet, Take 0.5 tablets (12.5 mg total) by mouth daily., Disp: 30 tablet, Rfl: 1 .  MULTIPLE VITAMIN PO, Take by mouth daily. , Disp: , Rfl:   Review of Systems  Constitutional: Negative.   HENT: Negative.   Respiratory: Negative for apnea, chest tightness and shortness of breath.   Cardiovascular: Positive for palpitations. Negative for chest pain and leg swelling.  Gastrointestinal: Negative.  Negative for nausea.  Endocrine: Negative.   Genitourinary: Negative.   Musculoskeletal: Negative.   Skin: Negative.   Allergic/Immunologic: Negative.   Neurological: Negative for dizziness and numbness.  Hematological: Negative.   Psychiatric/Behavioral: Negative.     Social History   Tobacco Use  . Smoking status: Never Smoker  . Smokeless tobacco: Never Used  Substance Use Topics  . Alcohol use: No      Objective:   BP 137/74   Pulse 76   Temp (!) 97.4 F (36.3 C) (Temporal)   Resp 18   Ht 5\' 4"  (1.626 m)   Wt 151 lb 12.8 oz (68.9 kg)   SpO2 100%   BMI 26.06 kg/m  Vitals:   11/21/19 0813  BP: 137/74  Pulse: 76  Resp: 18  Temp: (!) 97.4 F (36.3 C)  TempSrc: Temporal  SpO2: 100%  Weight:  151 lb 12.8 oz (68.9 kg)  Height: 5\' 4"  (1.626 m)  Body mass index is 26.06 kg/m.   Physical Exam   General Appearance:    Well developed, well nourished female in no acute distress  Eyes:    PERRL, conjunctiva/corneas clear, EOM's intact       Lungs:     Clear to auscultation bilaterally, respirations unlabored  Heart:    Normal heart rate. Normal rhythm. No murmurs, rubs, or gallops.   MS:   All extremities are intact. Pea sized flesh colored non-tender nodule over left 4th DIP        Assessment & Plan    1. Frequent unifocal PVCs Improved with low dose of metoprolol. She is concerned about increasing dose  due to occasionally low BP. Will add - Magnesium 500 MG TABS; Take 1 tablet (500 mg total) by mouth daily.  2. Epidermoid cyst of finger of left hand  - Ambulatory referral to Dermatology     Lelon Huh, MD  [facility]

## 2019-11-21 ENCOUNTER — Other Ambulatory Visit: Payer: Self-pay

## 2019-11-21 ENCOUNTER — Ambulatory Visit (INDEPENDENT_AMBULATORY_CARE_PROVIDER_SITE_OTHER): Payer: Medicare Other | Admitting: Family Medicine

## 2019-11-21 ENCOUNTER — Encounter: Payer: Self-pay | Admitting: Family Medicine

## 2019-11-21 VITALS — BP 137/74 | HR 76 | Temp 97.4°F | Resp 18 | Ht 64.0 in | Wt 151.8 lb

## 2019-11-21 DIAGNOSIS — L720 Epidermal cyst: Secondary | ICD-10-CM | POA: Diagnosis not present

## 2019-11-21 DIAGNOSIS — I493 Ventricular premature depolarization: Secondary | ICD-10-CM | POA: Diagnosis not present

## 2019-11-21 MED ORDER — MAGNESIUM 500 MG PO TABS: 500.0000 mg | ORAL_TABLET | Freq: Every day | ORAL | Status: DC

## 2019-11-21 NOTE — Patient Instructions (Signed)
.   Please review the attached list of medications and notify my office if there are any errors.   . Please bring all of your medications to every appointment so we can make sure that our medication list is the same as yours.    Add OTC Magnesium oxide 500mg  tablet once a day to help reduce heart palpiations

## 2019-12-13 ENCOUNTER — Other Ambulatory Visit: Payer: Self-pay | Admitting: Family Medicine

## 2019-12-13 DIAGNOSIS — R002 Palpitations: Secondary | ICD-10-CM

## 2019-12-15 DIAGNOSIS — D485 Neoplasm of uncertain behavior of skin: Secondary | ICD-10-CM | POA: Diagnosis not present

## 2019-12-15 DIAGNOSIS — L980 Pyogenic granuloma: Secondary | ICD-10-CM | POA: Diagnosis not present

## 2019-12-29 ENCOUNTER — Ambulatory Visit: Payer: Medicare Other

## 2019-12-29 ENCOUNTER — Ambulatory Visit: Payer: Medicare Other | Attending: Internal Medicine

## 2019-12-29 DIAGNOSIS — Z23 Encounter for immunization: Secondary | ICD-10-CM | POA: Insufficient documentation

## 2019-12-29 NOTE — Progress Notes (Signed)
   Covid-19 Vaccination Clinic  Name:  Caitlyn Baker    MRN: [id number] DOB: Aug 14, 1951  12/29/2019  [name] was observed post Covid-19 immunization for 15 minutes without incidence. She was provided with Vaccine Information Sheet and instruction to access the V-Safe system.   [name] was instructed to call 911 with any severe reactions post vaccine: Marland Kitchen Difficulty breathing  . Swelling of your face and throat  . A fast heartbeat  . A bad rash all over your body  . Dizziness and weakness    Immunizations Administered    Name Date Dose VIS Date Route   Pfizer COVID-19 Vaccine 12/29/2019 10:15 AM 0.3 mL 10/13/2019 Intramuscular   Manufacturer: [place]   Lot: [id number]   [place]: [id number]

## 2020-01-23 ENCOUNTER — Ambulatory Visit: Payer: Medicare Other | Attending: Internal Medicine

## 2020-01-23 DIAGNOSIS — Z23 Encounter for immunization: Secondary | ICD-10-CM

## 2020-01-23 NOTE — Progress Notes (Signed)
   Covid-19 Vaccination Clinic  Name:  Caitlyn Baker    MRN: [id number] DOB: December 14, 1950  01/23/2020  [name] was observed post Covid-19 immunization for 15 minutes without incident. She was provided with Vaccine Information Sheet and instruction to access the V-Safe system.   [name] was instructed to call 911 with any severe reactions post vaccine: Marland Kitchen Difficulty breathing  . Swelling of face and throat  . A fast heartbeat  . A bad rash all over body  . Dizziness and weakness   Immunizations Administered    Name Date Dose VIS Date Route   Pfizer COVID-19 Vaccine 01/23/2020  2:42 PM 0.3 mL 10/13/2019 Intramuscular   Manufacturer: [employer]   Lot: [id number]   [place]: [id number]

## 2020-04-16 NOTE — Progress Notes (Signed)
Subjective:   Caitlyn Baker is a 69 y.o. female who presents for Medicare Annual (Subsequent) preventive examination.  I connected with Caitlyn Baker today by telephone and verified that I am speaking with the correct person using two identifiers. Location patient: home Location provider: work Persons participating in the virtual visit: patient, provider.   I discussed the limitations, risks, security and privacy concerns of performing an evaluation and management service by telephone and the availability of in person appointments. I also discussed with the patient that there may be a patient responsible charge related to this service. The patient expressed understanding and verbally consented to this telephonic visit.    Interactive audio and video telecommunications were attempted between this provider and patient, however failed, due to patient having technical difficulties OR patient did not have access to video capability.  We continued and completed visit with audio only.  Review of Systems:  N/A  Cardiac Risk Factors include: advanced age (>59men, >20 women);dyslipidemia;hypertension     Objective:     Vitals: There were no vitals taken for this visit.  There is no height or weight on file to calculate BMI.  Advanced Directives 04/17/2020 04/12/2019 04/05/2018 03/03/2017 11/22/2015 11/22/2015  Does Patient Have a Medical Advance Directive? Yes Yes Yes Yes Yes No  Type of Paramedic of [place];Living will [place];Living will [place];Living will Living will [place];Living will -  Does patient want to make changes to medical advance directive? - - - No - Patient declined - -  Copy of [place] in Chart? No - copy requested No - copy requested No - copy requested - - -    Tobacco Social History   Tobacco Use  Smoking Status Never Smoker  Smokeless Tobacco Never Used       Counseling given: Not Answered   Clinical Intake:  Pre-visit preparation completed: Yes  Pain : No/denies pain     Nutritional Risks: None Diabetes: No  How often do you need to have someone help you when you read instructions, pamphlets, or other written materials from your doctor or pharmacy?: 1 - Never  Interpreter Needed?: No  Information entered by :: [facility], LPN  History reviewed. No pertinent past medical history. Past Surgical History:  Procedure Laterality Date  . MOUTH SURGERY     Family History  Problem Relation Age of Onset  . Hypertension Mother   . Breast cancer Neg Hx    Social History   Socioeconomic History  . Marital status: Married    Spouse name: Not on file  . Number of children: 3  . Years of education: Not on file  . Highest education level: Some college, no degree  Occupational History  . Occupation: retired  Tobacco Use  . Smoking status: Never Smoker  . Smokeless tobacco: Never Used  Vaping Use  . Vaping Use: Never used  Substance and Sexual Activity  . Alcohol use: No  . Drug use: No  . Sexual activity: Not on file  Other Topics Concern  . Not on file  Social History Narrative  . Not on file   Social Determinants of Health   Financial Resource Strain: Low Risk   . Difficulty of Paying Living Expenses: Not hard at all  Food Insecurity: No Food Insecurity  . Worried About Charity fundraiser in the Last Year: Never true  . Ran Out of Food in the Last Year: Never  true  Transportation Needs: No Transportation Needs  . Lack of Transportation (Medical): No  . Lack of Transportation (Non-Medical): No  Physical Activity: Inactive  . Days of Exercise per Week: 0 days  . Minutes of Exercise per Session: 0 min  Stress: No Stress Concern Present  . Feeling of Stress : Not at all  Social Connections: Moderately Integrated  . Frequency of Communication with Friends and Family: More than three times a week  . Frequency of Social  Gatherings with Friends and Family: More than three times a week  . Attends Religious Services: More than 4 times per year  . Active Member of Clubs or Organizations: No  . Attends Archivist Meetings: Never  . Marital Status: Married    Outpatient Encounter Medications as of 04/17/2020  Medication Sig  . alendronate (FOSAMAX) 70 MG tablet Take 1 tablet (70 mg total) by mouth every 7 (seven) days. Take with a full glass of water on an empty stomach.  Marland Kitchen aspirin 81 MG tablet Take 81 mg by mouth daily.  . Cholecalciferol (VITAMIN D PO) Take 1 tablet by mouth daily. 1000 units  . Coenzyme Q10 (COQ10 PO) Take by mouth daily.   Marland Kitchen lovastatin (MEVACOR) 20 MG tablet TAKE 1 TABLET BY MOUTH EVERY DAY  . Magnesium 500 MG TABS Take 1 tablet (500 mg total) by mouth daily.  . metoprolol succinate (TOPROL-XL) 25 MG 24 hr tablet TAKE 1/2 TABLET BY MOUTH EVERY DAY  . MULTIPLE VITAMIN PO Take by mouth daily.   . Calcium Carbonate-Vitamin D 600-400 MG-UNIT tablet Take 1 tablet by mouth daily.  (Patient not taking: Reported on 04/17/2020)   No facility-administered encounter medications on file as of 04/17/2020.    Activities of Daily Living In your present state of health, do you have any difficulty performing the following activities: 04/17/2020  Hearing? N  Vision? N  Difficulty concentrating or making decisions? N  Walking or climbing stairs? N  Dressing or bathing? N  Doing errands, shopping? N  Preparing Food and eating ? N  Using the Toilet? N  In the past six months, have you accidently leaked urine? N  Do you have problems with loss of bowel control? N  Managing your Medications? N  Managing your Finances? N  Housekeeping or managing your Housekeeping? N  Some recent data might be hidden    Patient Care Team: Birdie Sons, MD as PCP - General (Family Medicine) Ubaldo Glassing Javier Docker, MD as Consulting Physician (Cardiology)    Assessment:   This is a routine wellness examination  for [place].  Exercise Activities and Dietary recommendations Current Exercise Habits: The patient does not participate in regular exercise at present, Exercise limited by: None identified  Goals    . Exercise 3x per week (30 min per time)     Recommend to start exercising 3 days a week for at least 30 minutes at a time.    . Prevent falls     Recommend to remove any items from the home that may cause slips or trips.       Fall Risk: Fall Risk  04/17/2020 09/18/2019 04/12/2019 04/05/2018 03/03/2017  Falls in the past year? 1 0 0 No No  Comment accidental fall due to foot geting caught - - - -  Number falls in past yr: 0 0 - - -  Injury with Fall? 0 0 - - -  Follow up Falls prevention discussed - - - -  FALL RISK PREVENTION PERTAINING TO THE HOME:  Any stairs in or around the home? Yes  If so, are there any without handrails? No   Home free of loose throw rugs in walkways, pet beds, electrical cords, etc? Yes  Adequate lighting in your home to reduce risk of falls? Yes   ASSISTIVE DEVICES UTILIZED TO PREVENT FALLS:  Life alert? No  Use of a cane, walker or w/c? No  Grab bars in the bathroom? No  Shower chair or bench in shower? Yes  Elevated toilet seat or a handicapped toilet? No    TIMED UP AND GO:  Was the test performed? No .    Depression Screen PHQ 2/9 Scores 04/17/2020 04/12/2019 04/05/2018 03/03/2017  PHQ - 2 Score 0 0 0 0  PHQ- 9 Score - - - 4     Cognitive Function     6CIT Screen 04/17/2020 04/12/2019 04/05/2018  What Year? 0 points 0 points 0 points  What month? 0 points 0 points 0 points  What time? 0 points 0 points 0 points  Count back from 20 0 points 0 points 0 points  Months in reverse 0 points 0 points 0 points  Repeat phrase 0 points 0 points 0 points  Total Score 0 0 0    Immunization History  Administered Date(s) Administered  . Fluad Quad(high Dose 65+) 06/28/2019  . Influenza Split 07/19/2010  . Influenza,inj,Quad PF,6+ Mos 07/31/2013  .  Influenza-Unspecified 09/04/2015, 08/16/2017, 09/01/2018  . PFIZER SARS-COV-2 Vaccination 12/29/2019, 01/23/2020  . Pneumococcal Conjugate-13 11/22/2015  . Pneumococcal Polysaccharide-23 03/03/2017  . Td 08/06/2004, 10/12/2014  . Tdap 10/12/2014    Qualifies for Shingles Vaccine? Yes . Due for Shingrix. Pt has been advised to call insurance company to determine out of pocket expense. Advised may also receive vaccine at local pharmacy or Health Dept. Verbalized acceptance and understanding.  Tdap: Up to date  Flu Vaccine: Up to date  Pneumococcal Vaccine: Completed series  Screening Tests Health Maintenance  Topic Date Due  . INFLUENZA VACCINE  06/02/2020  . Fecal DNA (Cologuard)  05/09/2021  . DEXA SCAN  05/24/2021  . MAMMOGRAM  08/23/2021  . TETANUS/TDAP  10/12/2024  . COVID-19 Vaccine  Completed  . Hepatitis C Screening  Completed  . PNA vac Low Risk Adult  Completed    Cancer Screenings:  Colorectal Screening: Cologuard completed 05/09/18. Repeat every 3 years.   Mammogram: Completed 08/24/19. Repeat every 1-2 years as advised.   Bone Density: Completed 05/25/19. Results reflect OSTEOPOROSIS. Repeat every 2 years.   Lung Cancer Screening: (Low Dose CT Chest recommended if Age 43-80 years, 30 pack-year currently smoking OR have quit w/in 15years.) does not qualify.   Additional Screening:  Hepatitis C Screening: Up to date  Vision Screening: Recommended annual ophthalmology exams for early detection of glaucoma and other disorders of the eye.  Dental Screening: Recommended annual dental exams for proper oral hygiene  Community Resource Referral:  CRR required this visit? No     Plan:  I have personally reviewed and addressed the Medicare Annual Wellness questionnaire and have noted the following in the patient's chart:  A. Medical and social history B. Use of alcohol, tobacco or illicit drugs  C. Current medications and supplements D. Functional ability and  status E.  Nutritional status F.  Physical activity G. Advance directives H. List of other physicians I.  Hospitalizations, surgeries, and ER visits in previous 12 months J.  [place] such as hearing and vision  if needed, cognitive and depression L. Referrals and appointments   In addition, I have reviewed and discussed with patient certain preventive protocols, quality metrics, and best practice recommendations. A written personalized care plan for preventive services as well as general preventive health recommendations were provided to patient. Nurse Health Advisor  Signed,    Rudolpho Claxton [place], [place]  [id number] Nurse Health Advisor   Nurse Notes: None

## 2020-04-17 ENCOUNTER — Other Ambulatory Visit: Payer: Self-pay

## 2020-04-17 ENCOUNTER — Ambulatory Visit (INDEPENDENT_AMBULATORY_CARE_PROVIDER_SITE_OTHER): Payer: Medicare Other

## 2020-04-17 DIAGNOSIS — Z0000 Encounter for general adult medical examination without abnormal findings: Secondary | ICD-10-CM

## 2020-04-17 NOTE — Patient Instructions (Signed)
Caitlyn Baker , Thank you for taking time to come for your Medicare Wellness Visit. I appreciate your ongoing commitment to your health goals. Please review the following plan we discussed and let me know if I can assist you in the future.   Screening recommendations/referrals: Colonoscopy: Cologuard up to date, due 05/09/21 Mammogram: Up to date, due 08/2021 Bone Density: Up to date, due 05/2021 Recommended yearly ophthalmology/optometry visit for glaucoma screening and checkup Recommended yearly dental visit for hygiene and checkup  Vaccinations: Influenza vaccine: Up to date Pneumococcal vaccine: Completed series Tdap vaccine: Up to date, due 10/2024 Shingles vaccine: Pt declines today.     Advanced directives: Please bring a copy of your POA (Power of Attorney) and/or Living Will to your next appointment.   Conditions/risks identified: Fall risk preventatives discussed today. Recommend to start exercising 3 days a week for at least 30 minutes at a time.  Next appointment: 07/16/20 @ 3:00 PM with Dr Caryn Section    Preventive Care 70 Years and Older, Female Preventive care refers to lifestyle choices and visits with your health care provider that can promote health and wellness. What does preventive care include?  A yearly physical exam. This is also called an annual well check.  Dental exams once or twice a year.  Routine eye exams. Ask your health care provider how often you should have your eyes checked.  Personal lifestyle choices, including:  Daily care of your teeth and gums.  Regular physical activity.  Eating a healthy diet.  Avoiding tobacco and drug use.  Limiting alcohol use.  Practicing safe sex.  Taking low-dose aspirin every day.  Taking vitamin and mineral supplements as recommended by your health care provider. What happens during an annual well check? The services and screenings done by your health care provider during your annual well check will depend on your  age, overall health, lifestyle risk factors, and family history of disease. Counseling  Your health care provider may ask you questions about your:  Alcohol use.  Tobacco use.  Drug use.  Emotional well-being.  Home and relationship well-being.  Sexual activity.  Eating habits.  History of falls.  Memory and ability to understand (cognition).  Work and work Statistician.  Reproductive health. Screening  You may have the following tests or measurements:  Height, weight, and BMI.  Blood pressure.  Lipid and cholesterol levels. These may be checked every 5 years, or more frequently if you are over 70 years old.  Skin check.  Lung cancer screening. You may have this screening every year starting at age 70 if you have a 30-pack-year history of smoking and currently smoke or have quit within the past 15 years.  Fecal occult blood test (FOBT) of the stool. You may have this test every year starting at age 70.  Flexible sigmoidoscopy or colonoscopy. You may have a sigmoidoscopy every 5 years or a colonoscopy every 10 years starting at age 70.  Hepatitis C blood test.  Hepatitis B blood test.  Sexually transmitted disease (STD) testing.  Diabetes screening. This is done by checking your blood sugar (glucose) after you have not eaten for a while (fasting). You may have this done every 1-3 years.  Bone density scan. This is done to screen for osteoporosis. You may have this done starting at age 70.  Mammogram. This may be done every 1-2 years. Talk to your health care provider about how often you should have regular mammograms. Talk with your health care provider about your  test results, treatment options, and if necessary, the need for more tests. Vaccines  Your health care provider may recommend certain vaccines, such as:  Influenza vaccine. This is recommended every year.  Tetanus, diphtheria, and acellular pertussis (Tdap, Td) vaccine. You may need a Td booster  every 10 years.  Zoster vaccine. You may need this after age 70.  Pneumococcal 13-valent conjugate (PCV13) vaccine. One dose is recommended after age 24.  Pneumococcal polysaccharide (PPSV23) vaccine. One dose is recommended after age 13. Talk to your health care provider about which screenings and vaccines you need and how often you need them. This information is not intended to replace advice given to you by your health care provider. Make sure you discuss any questions you have with your health care provider. Document Released: 11/15/2015 Document Revised: 07/08/2016 Document Reviewed: 08/20/2015 Elsevier Interactive Patient Education  2017 [place] Prevention in the Home Falls can cause injuries. They can happen to people of all ages. There are many things you can do to make your home safe and to help prevent falls. What can I do on the outside of my home?  Regularly fix the edges of walkways and driveways and fix any cracks.  Remove anything that might make you trip as you walk through a door, such as a raised step or threshold.  Trim any bushes or trees on the path to your home.  Use bright outdoor lighting.  Clear any walking paths of anything that might make someone trip, such as rocks or tools.  Regularly check to see if handrails are loose or broken. Make sure that both sides of any steps have handrails.  Any raised decks and porches should have guardrails on the edges.  Have any leaves, snow, or ice cleared regularly.  Use sand or salt on walking paths during winter.  Clean up any spills in your garage right away. This includes oil or grease spills. What can I do in the bathroom?  Use night lights.  Install grab bars by the toilet and in the tub and shower. Do not use towel bars as grab bars.  Use non-skid mats or decals in the tub or shower.  If you need to sit down in the shower, use a plastic, non-slip stool.  Keep the floor dry. Clean up any  water that spills on the floor as soon as it happens.  Remove soap buildup in the tub or shower regularly.  Attach bath mats securely with double-sided non-slip rug tape.  Do not have throw rugs and other things on the floor that can make you trip. What can I do in the bedroom?  Use night lights.  Make sure that you have a light by your bed that is easy to reach.  Do not use any sheets or blankets that are too big for your bed. They should not hang down onto the floor.  Have a firm chair that has side arms. You can use this for support while you get dressed.  Do not have throw rugs and other things on the floor that can make you trip. What can I do in the kitchen?  Clean up any spills right away.  Avoid walking on wet floors.  Keep items that you use a lot in easy-to-reach places.  If you need to reach something above you, use a strong step stool that has a grab bar.  Keep electrical cords out of the way.  Do not use floor polish or wax that  makes floors slippery. If you must use wax, use non-skid floor wax.  Do not have throw rugs and other things on the floor that can make you trip. What can I do with my stairs?  Do not leave any items on the stairs.  Make sure that there are handrails on both sides of the stairs and use them. Fix handrails that are broken or loose. Make sure that handrails are as long as the stairways.  Check any carpeting to make sure that it is firmly attached to the stairs. Fix any carpet that is loose or worn.  Avoid having throw rugs at the top or bottom of the stairs. If you do have throw rugs, attach them to the floor with carpet tape.  Make sure that you have a light switch at the top of the stairs and the bottom of the stairs. If you do not have them, ask someone to add them for you. What else can I do to help prevent falls?  Wear shoes that:  Do not have high heels.  Have rubber bottoms.  Are comfortable and fit you well.  Are closed  at the toe. Do not wear sandals.  If you use a stepladder:  Make sure that it is fully opened. Do not climb a closed stepladder.  Make sure that both sides of the stepladder are locked into place.  Ask someone to hold it for you, if possible.  Clearly mark and make sure that you can see:  Any grab bars or handrails.  First and last steps.  Where the edge of each step is.  Use tools that help you move around (mobility aids) if they are needed. These include:  Canes.  Walkers.  Scooters.  Crutches.  Turn on the lights when you go into a dark area. Replace any light bulbs as soon as they burn out.  Set up your furniture so you have a clear path. Avoid moving your furniture around.  If any of your floors are uneven, fix them.  If there are any pets around you, be aware of where they are.  Review your medicines with your doctor. Some medicines can make you feel dizzy. This can increase your chance of falling. Ask your doctor what other things that you can do to help prevent falls. This information is not intended to replace advice given to you by your health care provider. Make sure you discuss any questions you have with your health care provider. Document Released: 08/15/2009 Document Revised: 03/26/2016 Document Reviewed: 11/23/2014 Elsevier Interactive Patient Education  2017 [employer].

## 2020-06-05 ENCOUNTER — Other Ambulatory Visit: Payer: Self-pay | Admitting: Family Medicine

## 2020-06-05 DIAGNOSIS — R002 Palpitations: Secondary | ICD-10-CM

## 2020-07-16 ENCOUNTER — Encounter: Payer: Self-pay | Admitting: Family Medicine

## 2020-07-16 ENCOUNTER — Other Ambulatory Visit: Payer: Self-pay

## 2020-07-16 ENCOUNTER — Ambulatory Visit (INDEPENDENT_AMBULATORY_CARE_PROVIDER_SITE_OTHER): Payer: Medicare Other | Admitting: Family Medicine

## 2020-07-16 VITALS — BP 123/72 | HR 79 | Temp 98.1°F | Resp 16 | Ht 64.0 in | Wt 157.0 lb

## 2020-07-16 DIAGNOSIS — R739 Hyperglycemia, unspecified: Secondary | ICD-10-CM

## 2020-07-16 DIAGNOSIS — E7800 Pure hypercholesterolemia, unspecified: Secondary | ICD-10-CM

## 2020-07-16 DIAGNOSIS — H6121 Impacted cerumen, right ear: Secondary | ICD-10-CM

## 2020-07-16 DIAGNOSIS — Z23 Encounter for immunization: Secondary | ICD-10-CM

## 2020-07-16 DIAGNOSIS — E559 Vitamin D deficiency, unspecified: Secondary | ICD-10-CM

## 2020-07-16 MED ORDER — ALENDRONATE SODIUM 70 MG PO TABS: 70.0000 mg | ORAL_TABLET | ORAL | 11 refills | Status: DC

## 2020-07-16 NOTE — Progress Notes (Signed)
I,Caitlyn Baker,acting as a scribe for Lelon Huh, MD.,have documented all relevant documentation on the behalf of Lelon Huh, MD,as directed by  Lelon Huh, MD while in the presence of Lelon Huh, MD. Established patient visit   Patient: Caitlyn Baker   DOB: 24-Oct-1951   69 y.o. Female  MRN: [id number] Visit Date: 07/16/2020  Today's healthcare provider: Lelon Huh, MD   Chief Complaint  Patient presents with  . Hyperlipidemia   Subjective    HPI  Had AWV with [facility] on 04/17/2020  Follow up for Frequent unifocal PVCs:  The patient was last seen for this 8 months ago. Changes made at last visit include adding Magnesium 500 MG TABS daily.  She reports good compliance with treatment. She feels that condition is stable. She is not having side effects.   -----------------------------------------------------------------------------------------  Lipid/Cholesterol, Follow-up  Last lipid panel Other pertinent labs  Lab Results  Component Value Date   CHOL 229 (H) 06/28/2019   HDL 63 06/28/2019   LDLCALC 133 (H) 06/28/2019   TRIG 165 (H) 06/28/2019   CHOLHDL 3.6 06/28/2019   Lab Results  Component Value Date   ALT 14 06/28/2019   AST 15 06/28/2019   PLT 332 09/18/2019   TSH 1.440 09/18/2019     She was last seen for this 1 year ago.  Management since that visit includes continuing lovastatin and advising patient to try cutting back on saturated fats in diet.   She reports good compliance with treatment. She is not having side effects.   Symptoms: No chest pain No chest pressure/discomfort  No dyspnea No lower extremity edema  No numbness or tingling of extremity No orthopnea  No palpitations No paroxysmal nocturnal dyspnea  No speech difficulty No syncope   Current exercise: walking  The 10-year ASCVD risk score Mikey Bussing DC Jr., et al., 2013) is:  8.9%  ------------------------------------------------------------------------------------------------- Follow up for vitamin D Deficiency:  The patient was last seen for this 1 year ago. Changes made at last visit include none; continue supplement.  She reports good compliance with treatment. She feels that condition is Unchanged. She is not having side effects.  Lab Results  Component Value Date   VD25OH 27.7 (L) 06/28/2019    -----------------------------------------------------------------------------------------  She also reports she occasionally notices ringing and difficulty hearing out of right ear, but does not have any pain.      Medications: Outpatient Medications Prior to Visit  Medication Sig  . alendronate (FOSAMAX) 70 MG tablet Take 1 tablet (70 mg total) by mouth every 7 (seven) days. Take with a full glass of water on an empty stomach.  Marland Kitchen aspirin 81 MG tablet Take 81 mg by mouth daily.  . Calcium Carbonate-Vitamin D 600-400 MG-UNIT tablet Take 1 tablet by mouth daily.  (Patient not taking: Reported on 04/17/2020)  . Cholecalciferol (VITAMIN D PO) Take 1 tablet by mouth daily. 1000 units  . Coenzyme Q10 (COQ10 PO) Take by mouth daily.   Marland Kitchen lovastatin (MEVACOR) 20 MG tablet TAKE 1 TABLET BY MOUTH EVERY DAY  . Magnesium 500 MG TABS Take 1 tablet (500 mg total) by mouth daily.  . metoprolol succinate (TOPROL-XL) 25 MG 24 hr tablet TAKE 1/2 TABLET BY MOUTH EVERY DAY  . MULTIPLE VITAMIN PO Take by mouth daily.    No facility-administered medications prior to visit.    Review of Systems  Constitutional: Positive for fatigue. Negative for chills and fever.  HENT: Negative for congestion, ear pain, rhinorrhea, sneezing  and sore throat.   Eyes: Negative.  Negative for pain and redness.  Respiratory: Negative for cough, shortness of breath and wheezing.   Cardiovascular: Negative for chest pain and leg swelling.  Gastrointestinal: Negative for abdominal pain, blood in  stool, constipation, diarrhea and nausea.  Endocrine: Negative for polydipsia and polyphagia.  Genitourinary: Negative.  Negative for dysuria, flank pain, hematuria, pelvic pain, vaginal bleeding and vaginal discharge.  Musculoskeletal: Positive for myalgias. Negative for arthralgias, back pain, gait problem and joint swelling.  Skin: Negative for rash.  Neurological: Negative.  Negative for dizziness, tremors, seizures, weakness, light-headedness, numbness and headaches.  Hematological: Negative for adenopathy.  Psychiatric/Behavioral: Negative.  Negative for behavioral problems, confusion and dysphoric mood. The patient is not nervous/anxious and is not hyperactive.       Objective    BP 123/72 (BP Location: Left Arm, Patient Position: Sitting, Cuff Size: Normal)   Pulse 79   Temp 98.1 F (36.7 C) (Oral)   Resp 16   Ht 5\' 4"  (1.626 m)   Wt 157 lb (71.2 kg)   BMI 26.95 kg/m    Physical Exam   General Appearance:     Overweight female. Alert, cooperative, in no acute distress, appears stated age   Head:    Normocephalic, without obvious abnormality, atraumatic  Eyes:    PERRL, conjunctiva/corneas clear, EOM's intact, fundi    benign, both eyes  Ears:    Right cerumen impaction  Neck:   Supple, symmetrical, trachea midline, no adenopathy;    thyroid:  no enlargement/tenderness/nodules; no carotid   bruit or JVD  Back:     Symmetric, ROM normal, no CVA tenderness  Lungs:     Clear to auscultation bilaterally, respirations unlabored  Chest Wall:    No tenderness or deformity   Heart:    Normal heart rate. Normal rhythm. No murmurs, rubs, or gallops.   Breast Exam:    normal appearance, no masses or tenderness  Abdomen:     Soft, non-tender, bowel sounds active all four quadrants,    no masses, no organomegaly  Pelvic:    deferred  Extremities:   All extremities are intact. No cyanosis or edema  Pulses:   2+ and symmetric all extremities  Skin:   Skin color, texture, turgor  normal, no rashes or lesions  Lymph nodes:   Cervical, supraclavicular, and axillary nodes normal  Neurologic:   CNII-XII intact, normal strength, sensation and reflexes    throughout      Assessment & Plan     1. Vitamin D deficiency  - VITAMIN D 25 Hydroxy (Vit-D Deficiency, Fractures)  2. Hypercholesteremia She is tolerating atorvastatin well with no adverse effects.   - Lipid panel - Renal function panel  3. Blood glucose elevated . - Renal function panel - Hemoglobin A1c  4. Hearing loss of right ear due to cerumen impaction After soaking with Debrox, ear canal was irrigated with water until clear. Patient tolerated procedure well.   5. Need for influenza vaccination  - Flu Vaccine QUAD High Dose IM (Fluad)  6. Osteoporosis Refill - alendronate (FOSAMAX) 70 MG tablet; Take 1 tablet (70 mg total) by mouth every 7 (seven) days. Take with a full glass of water on an empty stomach.  Dispense: 4 tablet; Refill: 11        The entirety of the information documented in the History of Present Illness, Review of Systems and Physical Exam were personally obtained by me. Portions of this information were initially  documented by the CMA and reviewed by me for thoroughness and accuracy.      Lelon Huh, MD  [facility] [phone] (phone) [phone] (fax)  [place]

## 2020-07-16 NOTE — Patient Instructions (Signed)
•   Please review the attached list of medications and notify my office if there are any errors.    Please bring all of your medications to every appointment so we can make sure that our medication list is the same as yours.    You will be due for a Cologuard test for colon cancer screening in July of 2022. You will receive a collection in the mail when that test is due.

## 2020-07-17 DIAGNOSIS — E559 Vitamin D deficiency, unspecified: Secondary | ICD-10-CM | POA: Diagnosis not present

## 2020-07-17 DIAGNOSIS — E7800 Pure hypercholesterolemia, unspecified: Secondary | ICD-10-CM | POA: Diagnosis not present

## 2020-07-17 DIAGNOSIS — R739 Hyperglycemia, unspecified: Secondary | ICD-10-CM | POA: Diagnosis not present

## 2020-07-18 LAB — RENAL FUNCTION PANEL
Albumin: 4.5 g/dL (ref 3.8–4.8)
BUN/Creatinine Ratio: 17 (ref 12–28)
BUN: 12 mg/dL (ref 8–27)
CO2: 22 mmol/L (ref 20–29)
Calcium: 10.5 mg/dL — ABNORMAL HIGH (ref 8.7–10.3)
Chloride: 104 mmol/L (ref 96–106)
Creatinine, Ser: 0.7 mg/dL (ref 0.57–1.00)
GFR calc Af Amer: 102 mL/min/{1.73_m2} (ref >59)
GFR calc non Af Amer: 88 mL/min/{1.73_m2} (ref >59)
Glucose: 85 mg/dL (ref 65–99)
Phosphorus: 2.6 mg/dL — ABNORMAL LOW (ref 3.0–4.3)
Potassium: 4.2 mmol/L (ref 3.5–5.2)
Sodium: 140 mmol/L (ref 134–144)

## 2020-07-18 LAB — VITAMIN D 25 HYDROXY (VIT D DEFICIENCY, FRACTURES): Vit D, 25-Hydroxy: 33.1 ng/mL (ref 30.0–100.0)

## 2020-07-18 LAB — LIPID PANEL
Chol/HDL Ratio: 4.1 ratio (ref 0.0–4.4)
Cholesterol, Total: 235 mg/dL — ABNORMAL HIGH (ref 100–199)
HDL: 58 mg/dL (ref >39)
LDL Chol Calc (NIH): 143 mg/dL — ABNORMAL HIGH (ref 0–99)
Triglycerides: 188 mg/dL — ABNORMAL HIGH (ref 0–149)
VLDL Cholesterol Cal: 34 mg/dL (ref 5–40)

## 2020-07-18 LAB — HEMOGLOBIN A1C
Est. average glucose Bld gHb Est-mCnc: 126 mg/dL
Hgb A1c MFr Bld: 6 % — ABNORMAL HIGH (ref 4.8–5.6)

## 2020-08-20 ENCOUNTER — Other Ambulatory Visit: Payer: Self-pay | Admitting: Family Medicine

## 2020-08-20 DIAGNOSIS — Z1231 Encounter for screening mammogram for malignant neoplasm of breast: Secondary | ICD-10-CM

## 2020-09-09 ENCOUNTER — Other Ambulatory Visit: Payer: Self-pay | Admitting: Family Medicine

## 2020-09-09 DIAGNOSIS — R002 Palpitations: Secondary | ICD-10-CM

## 2020-09-10 ENCOUNTER — Other Ambulatory Visit: Payer: Self-pay | Admitting: Family Medicine

## 2020-09-10 DIAGNOSIS — R002 Palpitations: Secondary | ICD-10-CM

## 2020-09-10 NOTE — Telephone Encounter (Signed)
Requested Prescriptions  Pending Prescriptions Disp Refills   metoprolol succinate (TOPROL-XL) 25 MG 24 hr tablet [Pharmacy Med Name: METOPROLOL SUCC ER 25 MG TAB] 45 tablet 1    Sig: TAKE 1/2 TABLET BY MOUTH EVERY DAY     Cardiovascular:  Beta Blockers Failed - 09/10/2020  1:51 AM      Failed - Valid encounter within last 6 months    Recent Outpatient Visits          1 month ago Need for influenza vaccination   [facility] Birdie Sons, MD   9 months ago Frequent unifocal PVCs   [facility] Birdie Sons, MD   11 months ago [place], Donald E, MD   1 year ago [place], Donald E, MD   1 year ago Cellulitis of finger of left hand   [place], MD             Passed - Last BP in normal range    BP Readings from Last 1 Encounters:  07/16/20 123/72         Passed - Last Heart Rate in normal range    Pulse Readings from Last 1 Encounters:  07/16/20 79

## 2020-09-20 ENCOUNTER — Other Ambulatory Visit: Payer: Self-pay

## 2020-09-20 ENCOUNTER — Ambulatory Visit
Admission: RE | Admit: 2020-09-20 | Discharge: 2020-09-20 | Disposition: A | Discharge status: Home / Self Care | Payer: Medicare Other | Source: Ambulatory Visit | Attending: Family Medicine | Admitting: Family Medicine

## 2020-09-20 DIAGNOSIS — Z1231 Encounter for screening mammogram for malignant neoplasm of breast: Secondary | ICD-10-CM

## 2020-10-04 DIAGNOSIS — Z1152 Encounter for screening for COVID-19: Secondary | ICD-10-CM | POA: Diagnosis not present

## 2020-10-04 DIAGNOSIS — Z03818 Encounter for observation for suspected exposure to other biological agents ruled out: Secondary | ICD-10-CM | POA: Diagnosis not present

## 2020-11-05 ENCOUNTER — Other Ambulatory Visit: Payer: Self-pay

## 2020-11-05 ENCOUNTER — Encounter: Payer: Self-pay | Admitting: Family Medicine

## 2020-11-05 ENCOUNTER — Ambulatory Visit (INDEPENDENT_AMBULATORY_CARE_PROVIDER_SITE_OTHER): Payer: Medicare Other | Admitting: Family Medicine

## 2020-11-05 VITALS — BP 145/79 | HR 93 | Temp 99.4°F | Resp 16 | Wt 156.0 lb

## 2020-11-05 DIAGNOSIS — R1909 Other intra-abdominal and pelvic swelling, mass and lump: Secondary | ICD-10-CM | POA: Diagnosis not present

## 2020-11-05 NOTE — Progress Notes (Signed)
°  ° ° °  Established patient visit   Patient: Caitlyn Baker   DOB: Jun 29, 1951   70 y.o. Female  MRN: [id number] Visit Date: 11/05/2020  Today's healthcare provider: Mila Merry, MD   No chief complaint on file.  Subjective    HPI  Right Inguinal mass: Patient complains of a lump within the right inguinal area. She first noticed the lump 1 month ago when she had severe cough due Covid. She denies any pain from the lump or change in size since then     Medications: Outpatient Medications Prior to Visit  Medication Sig   alendronate (FOSAMAX) 70 MG tablet Take 1 tablet (70 mg total) by mouth every 7 (seven) days. Take with a full glass of water on an empty stomach.   aspirin 81 MG tablet Take 81 mg by mouth daily.   Calcium Carbonate-Vitamin D 600-400 MG-UNIT tablet Take 1 tablet by mouth daily.   Cholecalciferol (VITAMIN D PO) Take 1 tablet by mouth daily. 1000 units   Coenzyme Q10 (COQ10 PO) Take by mouth daily.    lovastatin (MEVACOR) 20 MG tablet TAKE 1 TABLET BY MOUTH EVERY DAY   Magnesium 500 MG TABS Take 1 tablet (500 mg total) by mouth daily.   metoprolol succinate (TOPROL-XL) 25 MG 24 hr tablet TAKE 1/2 TABLET BY MOUTH EVERY DAY   MULTIPLE VITAMIN PO Take by mouth daily.    No facility-administered medications prior to visit.    Review of Systems  Constitutional: Negative for appetite change, chills, fatigue and fever.  Respiratory: Negative for chest tightness and shortness of breath.   Cardiovascular: Negative for chest pain and palpitations.  Gastrointestinal: Negative for abdominal pain, nausea and vomiting.  Neurological: Negative for dizziness and weakness.      Objective    BP (!) 145/79    Pulse 93    Temp 99.4 F (37.4 C) (Temporal)    Resp 16    Wt 156 lb (70.8 kg)    BMI 26.78 kg/m    Physical Exam   General appearance:  Overweight female, cooperative and in no acute distress Golf ball sized slightly firm mass right inguinal area bulges  with coughing.  No results found for any visits on 11/05/20.  Assessment & Plan     1. Mass of right inguinal region Suggestive of inguinal hernia.  - Ambulatory referral to General Surgery        The entirety of the information documented in the History of Present Illness, Review of Systems and Physical Exam were personally obtained by me. Portions of this information were initially documented by the CMA and reviewed by me for thoroughness and accuracy.      Mila Merry, MD  [facility] [phone] (phone) [phone] (fax)  [facility]

## 2020-11-05 NOTE — Patient Instructions (Signed)
.   Please review the attached list of medications and notify my office if there are any errors.   . Please bring all of your medications to every appointment so we can make sure that our medication list is the same as yours.   

## 2020-11-11 ENCOUNTER — Ambulatory Visit (INDEPENDENT_AMBULATORY_CARE_PROVIDER_SITE_OTHER): Payer: Medicare Other | Admitting: Surgery

## 2020-11-11 ENCOUNTER — Encounter: Payer: Self-pay | Admitting: Surgery

## 2020-11-11 ENCOUNTER — Other Ambulatory Visit: Payer: Self-pay

## 2020-11-11 VITALS — BP 162/79 | HR 96 | Temp 98.3°F | Ht 64.0 in | Wt 152.0 lb

## 2020-11-11 DIAGNOSIS — Z8616 Personal history of COVID-19: Secondary | ICD-10-CM | POA: Diagnosis not present

## 2020-11-11 DIAGNOSIS — K4090 Unilateral inguinal hernia, without obstruction or gangrene, not specified as recurrent: Secondary | ICD-10-CM | POA: Diagnosis not present

## 2020-11-11 NOTE — Progress Notes (Signed)
11/11/2020  Reason for Visit:  Right inguinal hernia  Referring Provider:  Lelon Huh, MD  History of Present Illness: Caitlyn Baker is a 70 y.o. female presenting for evaluation of a right inguinal hernia.  The patient reports she noticed it first around mid December while she was having a bout of COVID-19.  She was coughing more and felt a bulging sensation in the right groin.  Since then, she has felt something bulging out when she stands up or does anything with exertion.  But it reduces and goes flat when she lies down.  Denies any true pain, but reports some discomfort or burning in the upper inner right thigh.  Denies any issues on the left groin or umbilicus.  Denies any constipation, diarrhea, nausea, or vomiting, or voiding difficulties.  She has recovered from the COVID-19 standpoint.  She does have a history of palpations but does much better with Metoprolol.  Past Medical History: Past Medical History:  Diagnosis Date  . Hyperlipidemia      Past Surgical History: Past Surgical History:  Procedure Laterality Date  . MOUTH SURGERY      Home Medications: Prior to Admission medications   Medication Sig Start Date End Date Taking? Authorizing Provider  alendronate (FOSAMAX) 70 MG tablet Take 1 tablet (70 mg total) by mouth every 7 (seven) days. Take with a full glass of water on an empty stomach. 07/16/20  Yes Birdie Sons, MD  aspirin 81 MG tablet Take 81 mg by mouth daily.   Yes [provider]  Calcium Carbonate-Vitamin D 600-400 MG-UNIT tablet Take 1 tablet by mouth daily.   Yes [provider]  Cholecalciferol (VITAMIN D PO) Take 1 tablet by mouth daily. 1000 units   Yes [provider]  Coenzyme Q10 (COQ10 PO) Take by mouth daily.    Yes [provider]  lovastatin (MEVACOR) 20 MG tablet TAKE 1 TABLET BY MOUTH EVERY DAY 09/06/19  Yes Birdie Sons, MD  Magnesium 500 MG TABS Take 1 tablet (500 mg total) by mouth daily. 11/21/19   Yes Birdie Sons, MD  metoprolol succinate (TOPROL-XL) 25 MG 24 hr tablet TAKE 1/2 TABLET BY MOUTH EVERY DAY 09/10/20  Yes Birdie Sons, MD  MULTIPLE VITAMIN PO Take by mouth daily.    Yes [provider]    Allergies: Allergies  Allergen Reactions  . Codeine Nausea Only and Nausea And Vomiting  . Prednisone Other (See Comments)    Other reaction(s): Chest pain Severe anxiety and depression. Suicidal Severe anxiety and depression.    Social History:  reports that she has never smoked. She has never used smokeless tobacco. She reports that she does not drink alcohol and does not use drugs.   Family History: Family History  Problem Relation Age of Onset  . Hypertension Mother   . Breast cancer Neg Hx     Review of Systems: Review of Systems  Constitutional: Negative for chills and fever.  HENT: Negative for hearing loss.   Respiratory: Negative for shortness of breath.   Cardiovascular: Positive for palpitations. Negative for chest pain.  Gastrointestinal: Negative for abdominal pain, diarrhea, nausea and vomiting.  Genitourinary: Negative for dysuria.  Musculoskeletal: Negative for myalgias.  Skin: Negative for rash.  Neurological: Negative for dizziness.  Psychiatric/Behavioral: Negative for depression.    Physical Exam BP (!) 162/79   Pulse 96   Temp 98.3 F (36.8 C) (Oral)   Ht 5\' 4"  (1.626 m)   Wt 152  lb (68.9 kg)   SpO2 96%   BMI 26.09 kg/m  CONSTITUTIONAL: No acute distress HEENT:  Normocephalic, atraumatic, extraocular motion intact. NECK: Trachea is midline, and there is no jugular venous distension.  RESPIRATORY:  Normal respiratory effort without pathologic use of accessory muscles. CARDIOVASCULAR:  Regular rhythm and rate. GI: The abdomen is soft, non-distended, non-tender to palpation.  The patient has a reducible right inguinal hernia, more noticeable when she's standing up.  No hernia on the left groin.  Could have a hernia at the  umbilicus, but I'm unable to feel a true defect.  MUSCULOSKELETAL:  Normal muscle strength and tone in all four extremities.  No peripheral edema or cyanosis. SKIN: Skin turgor is normal. There are no pathologic skin lesions.  NEUROLOGIC:  Motor and sensation is grossly normal.  Cranial nerves are grossly intact. PSYCH:  Alert and oriented to person, place and time. Affect is normal.  Laboratory Analysis: No results found for this or any previous visit (from the past 24 hour(s)).  Imaging: No results found.  Assessment and Plan: This is a 70 y.o. female with a right inguinal hernia.  --The patient is minimally symptomatic from the hernia standpoint.  Discussed with her the natural history of hernias, and that unfortunately with time they can get bigger and possibly have more symptoms.  Discussed with her the role for conservative watchful waiting vs surgical repair.  She is interested in repair.  Reviewed with her the robotic approach including risks of bleeding, infection, and injury to surrounding structures.  Given the minimal symptoms, I also think that there is more flexibility with the timing of surgery and it can truly be elective rather than urgent.  She would like to wait until around late February to mid March.  She does help take care of her grandchildren during the day, so she would also like to get things set up before surgery.  I think that is reasonable.  We'll set up a follow up appointment for the end of February to update H&P and schedule surgery.  Return precautions reviewed as well.  Face-to-face time spent with the patient and care providers was 40 minutes, with more than 50% of the time spent counseling, educating, and coordinating care of the patient.     Melvyn Neth, [facility]

## 2020-11-11 NOTE — Patient Instructions (Addendum)
Go to the emergency room if you are having extreme pain or hernia is bulging and you can't push it back in.  If you have any concerns or questions, please feel free to call our office. See follow up appointment below.     Inguinal Hernia, Adult An inguinal hernia is when fat or your intestines push through a weak spot in a muscle where your leg meets your lower belly (groin). This causes a bulge. This kind of hernia could also be:  In your scrotum, if you are female.  In folds of skin around your vagina, if you are female. There are three types of inguinal hernias:  Hernias that can be pushed back into the belly (are reducible). This type rarely causes pain.  Hernias that cannot be pushed back into the belly (are incarcerated).  Hernias that cannot be pushed back into the belly and lose their blood supply (are strangulated). This type needs emergency surgery. What are the causes? This condition is caused by having a weak spot in the muscles or tissues in your groin. This develops over time. The hernia may poke through the weak spot when you strain your lower belly muscles all of a sudden, such as when you:  Lift a heavy object.  Strain to poop (have a bowel movement). Trouble pooping (constipation) can lead to straining.  Cough. What increases the risk? This condition is more likely to develop in:  Males.  Pregnant females.  People who: ? Are overweight. ? Work in jobs that require long periods of standing or heavy lifting. ? Have had an inguinal hernia before. ? Smoke or have lung disease. These factors can lead to long-term (chronic) coughing. What are the signs or symptoms? Symptoms may depend on the size of the hernia. Often, a small hernia has no symptoms. Symptoms of a larger hernia may include:  A bulge in the groin area. This is easier to see when standing. You might not be able to see it when you are lying down.  Pain or burning in the groin. This may get worse when  you lift, strain, or cough.  A dull ache or a feeling of pressure in the groin.  An abnormal bulge in the scrotum, in males. Symptoms of a strangulated inguinal hernia may include:  A bulge in your groin that is very painful and tender to the touch.  A bulge that turns red or purple.  Fever, feeling like you may vomit (nausea), and vomiting.  Not being able to poop or to pass gas. How is this treated? Treatment depends on the size of your hernia and whether you have symptoms. If you do not have symptoms, your doctor may have you watch your hernia carefully and have you come in for follow-up visits. If your hernia is large or if you have symptoms, you may need surgery to repair the hernia. Follow these instructions at home: Lifestyle  Avoid lifting heavy objects.  Avoid standing for long amounts of time.  Do not smoke or use any products that contain nicotine or tobacco. If you need help quitting, ask your doctor.  Stay at a healthy weight. Prevent trouble pooping You may need to take these actions to prevent or treat trouble pooping:  Drink enough fluid to keep your pee (urine) pale yellow.  Take over-the-counter or prescription medicines.  Eat foods that are high in fiber. These include beans, whole grains, and fresh fruits and vegetables.  Limit foods that are high in fat and  sugar. These include fried or sweet foods. General instructions  You may try to push your hernia back in place by very gently pressing on it when you are lying down. Do not try to push the bulge back in if it will not go in easily.  Watch your hernia for any changes in shape, size, or color. Tell your doctor if you see any changes.  Take over-the-counter and prescription medicines only as told by your doctor.  Keep all follow-up visits. Contact a doctor if:  You have a fever or chills.  You have new symptoms.  Your symptoms get worse. Get help right away if:  You have pain in your groin  that gets worse all of a sudden.  You have a bulge in your groin that: ? Gets bigger all of a sudden, and it does not get smaller after that. ? Turns red or purple. ? Is painful when you touch it.  You are a female, and you have: ? Sudden pain in your scrotum. ? A sudden change in the size of your scrotum.  You cannot push the hernia back in place by very gently pressing on it when you are lying down.  You feel like you may vomit, and that feeling does not go away.  You keep vomiting.  You have a fast heartbeat.  You cannot poop or pass gas. These symptoms may be an emergency. Get help right away. Call your local emergency services (911 in the [place]  Do not wait to see if the symptoms will go away.  Do not drive yourself to the hospital. Summary  An inguinal hernia is when fat or your intestines push through a weak spot in a muscle where your leg meets your lower belly (groin). This causes a bulge.  If you do not have symptoms, you may not need treatment. If you have symptoms or a large hernia, you may need surgery.  Avoid lifting heavy objects. Also, avoid standing for long amounts of time.  Do not try to push the bulge back in if it will not go in easily. This information is not intended to replace advice given to you by your health care provider. Make sure you discuss any questions you have with your health care provider. Document Revised: 06/18/2020 Document Reviewed: 06/18/2020 Elsevier Patient Education  2021 [employer].

## 2020-11-28 ENCOUNTER — Other Ambulatory Visit: Payer: Self-pay | Admitting: Family Medicine

## 2020-11-28 DIAGNOSIS — E7800 Pure hypercholesterolemia, unspecified: Secondary | ICD-10-CM

## 2020-11-28 NOTE — Telephone Encounter (Signed)
Requested Prescriptions  Pending Prescriptions Disp Refills  . lovastatin (MEVACOR) 20 MG tablet [Pharmacy Med Name: LOVASTATIN 20 MG TABLET] 90 tablet 2    Sig: TAKE 1 TABLET BY MOUTH EVERY DAY     Cardiovascular:  Antilipid - Statins Failed - 11/28/2020  3:32 AM      Failed - Total Cholesterol in normal range and within 360 days    Cholesterol, Total  Date Value Ref Range Status  07/17/2020 235 (H) 100 - 199 mg/dL Final         Failed - LDL in normal range and within 360 days    LDL Chol Calc (NIH)  Date Value Ref Range Status  07/17/2020 143 (H) 0 - 99 mg/dL Final         Failed - Triglycerides in normal range and within 360 days    Triglycerides  Date Value Ref Range Status  07/17/2020 188 (H) 0 - 149 mg/dL Final         Passed - HDL in normal range and within 360 days    HDL  Date Value Ref Range Status  07/17/2020 58 >39 mg/dL Final         Passed - Patient is not pregnant      Passed - Valid encounter within last 12 months    Recent Outpatient Visits          3 weeks ago Mass of right inguinal region   [place], Kirstie Peri, MD   4 months ago Need for influenza vaccination   [facility] Birdie Sons, MD   1 year ago Frequent unifocal PVCs   [facility] Birdie Sons, MD   1 year ago [place], Donald E, MD   1 year ago [place], Donald E, MD

## 2020-12-27 ENCOUNTER — Ambulatory Visit (INDEPENDENT_AMBULATORY_CARE_PROVIDER_SITE_OTHER): Payer: Medicare Other | Admitting: Surgery

## 2020-12-27 ENCOUNTER — Other Ambulatory Visit: Payer: Self-pay

## 2020-12-27 ENCOUNTER — Encounter: Payer: Self-pay | Admitting: Surgery

## 2020-12-27 ENCOUNTER — Telehealth: Payer: Self-pay

## 2020-12-27 VITALS — BP 149/73 | HR 92 | Temp 97.8°F | Ht 64.0 in | Wt 153.2 lb

## 2020-12-27 DIAGNOSIS — K4090 Unilateral inguinal hernia, without obstruction or gangrene, not specified as recurrent: Secondary | ICD-10-CM

## 2020-12-27 NOTE — Telephone Encounter (Signed)
Faxed medical clearance to Dr. Fisher at [phone]. 

## 2020-12-27 NOTE — Patient Instructions (Addendum)
Our surgery scheduler Pamala Hurry will call you within 24-48 to hours get you scheduled. If you have not heard from her after 48 hours, please call our office. You will need to get Covid tested before surgery and have the blue sheet available when she calls to write down important information. A medical clearance will be sent to Dr. Caryn Section. Please STOP taking Aspirin on 01/01/2021 and restart the day after your surgery. If you have any concerns or questions, please feel free to call our office.     Laparoscopic Inguinal Hernia Repair, Adult, Care After The following information offers guidance on how to care for yourself after your procedure. Your health care provider may also give you more specific instructions. If you have problems or questions, contact your health care provider. What can I expect after the procedure? After the procedure, it is common to have:  Pain.  Swelling and bruising around the incision area.  Scrotal swelling, in males.  Some fluid or blood draining from your incisions. Follow these instructions at home: Medicines  Take over-the-counter and prescription medicines only as told by your health care provider.  Ask your health care provider if the medicine prescribed to you: ? Requires you to avoid driving or using machinery. ? Can cause constipation. You may need to take these actions to prevent or treat constipation:  Drink enough fluid to keep your urine pale yellow.  Take over-the-counter or prescription medicines.  Eat foods that are high in fiber, such as beans, whole grains, and fresh fruits and vegetables.  Limit foods that are high in fat and processed sugars, such as fried or sweet foods. Incision care  Follow instructions from your health care provider about how to take care of your incisions. Make sure you: ? Wash your hands with soap and water for at least 20 seconds before and after you change your bandage (dressing). If soap and water are not  available, use hand sanitizer. ? Change your dressing as told by your health care provider. ? Leave stitches (sutures), skin glue, or adhesive strips in place. These skin closures may need to stay in place for 2 weeks or longer. If adhesive strip edges start to loosen and curl up, you may trim the loose edges. Do not remove adhesive strips completely unless your health care provider tells you to do that.  Check your incision area every day for signs of infection. Check for: ? More redness, swelling, or pain. ? More fluid or blood. ? Warmth. ? Pus or a bad smell.  Wear loose, soft clothing while your incisions heal.   Managing pain and swelling If directed, put ice on the painful or swollen areas. To do this:  Put ice in a plastic bag.  Place a towel between your skin and the bag.  Leave the ice on for 20 minutes, 2-3 times a day.  Remove the ice if your skin turns bright red. This is very important. If you cannot feel pain, heat, or cold, you have a greater risk of damage to the area.   Activity  Do not lift anything that is heavier than 10 lb (4.5 kg), or the limit that you are told, until your health care provider says that it is safe.  Ask your health care provider what activities are safe for you. A lot of activity during the first week after surgery can increase pain and swelling. For 1 week after your procedure: ? Avoid activities that take a lot of effort, such as  exercise or sports. ? You may walk and climb stairs as needed for daily activity, but avoid long walks or climbing stairs for exercise. General instructions  If you were given a sedative during the procedure, it can affect you for several hours. Do not drive or operate machinery until your health care provider says that it is safe.  Do not take baths, swim, or use a hot tub until your health care provider approves. Ask your health care provider if you may take showers. You may only be allowed to take sponge  baths.  Do not use any products that contain nicotine or tobacco. These products include cigarettes, chewing tobacco, and vaping devices, such as e-cigarettes. If you need help quitting, ask your health care provider.  Keep all follow-up visits. This is important. Contact a health care provider if:  You have any of these signs of infection: ? More redness, swelling, or pain around your incisions or your groin area. ? More fluid or blood coming from an incision. ? Warmth coming from an incision. ? Pus or a bad smell coming from an incision. ? A fever or chills.  You have more swelling in your scrotum, if you are female.  You have severe pain and medicines do not help.  You have abdominal pain or swelling.  You cannot urinate or have a bowel movement.  You faint or feel dizzy.  You have nausea and vomiting. Get help right away if:  You have redness, warmth, or pain in your leg.  You have chest pain.  You have problems breathing. These symptoms may represent a serious problem that is an emergency. Do not wait to see if the symptoms will go away. Get medical help right away. Call your local emergency services (911 in the [place] Do not drive yourself to the hospital. Summary  Pain, swelling, and bruising are common after the procedure.  Check your incision area every day for signs of infection, such as more redness, swelling, or pain.  Put ice on painful or swollen areas for 20 minutes, 2-3 times a day. This information is not intended to replace advice given to you by your health care provider. Make sure you discuss any questions you have with your health care provider. Document Revised: 06/18/2020 Document Reviewed: 06/18/2020 Elsevier Patient Education  2021 [employer].

## 2020-12-27 NOTE — H&P (View-Only) (Signed)
12/27/2020  History of Present Illness: Caitlyn Baker is a 70 y.o. female presenting for follow up of a right inguinal hernia.  She was last seen on 11/11/20 for initial assessment, and the patient wanted to wait to schedule surgery until March.  She reports that in the interim, she continues to feel the bulging sensation in the right groin, although she does not have significant symptoms like pain or burning anymore.  She does notice the bulging when she's doing activity.  Denies any issues on the left groin.    Past Medical History: Past Medical History:  Diagnosis Date  . Hyperlipidemia      Past Surgical History: Past Surgical History:  Procedure Laterality Date  . MOUTH SURGERY      Home Medications: Prior to Admission medications   Medication Sig Start Date End Date Taking? Authorizing Provider  alendronate (FOSAMAX) 70 MG tablet Take 1 tablet (70 mg total) by mouth every 7 (seven) days. Take with a full glass of water on an empty stomach. 07/16/20  Yes Birdie Sons, MD  aspirin 81 MG tablet Take 81 mg by mouth daily.   Yes [provider]  Calcium Carbonate-Vitamin D 600-400 MG-UNIT tablet Take 1 tablet by mouth daily.   Yes [provider]  Cholecalciferol (VITAMIN D PO) Take 1 tablet by mouth daily. 1000 units   Yes [provider]  Coenzyme Q10 (COQ10 PO) Take by mouth daily.    Yes [provider]  lovastatin (MEVACOR) 20 MG tablet TAKE 1 TABLET BY MOUTH EVERY DAY 11/28/20  Yes Birdie Sons, MD  Magnesium 250 MG TABS Take by mouth.   Yes [provider]  metoprolol succinate (TOPROL-XL) 25 MG 24 hr tablet TAKE 1/2 TABLET BY MOUTH EVERY DAY 09/10/20  Yes Birdie Sons, MD  MULTIPLE VITAMIN PO Take by mouth daily.    Yes [provider]    Allergies: Allergies  Allergen Reactions  . Codeine Nausea Only and Nausea And Vomiting  . Prednisone Other (See Comments)    Other reaction(s): Chest pain Severe anxiety and  depression. Suicidal Severe anxiety and depression.    Review of Systems: Review of Systems  Constitutional: Negative for chills and fever.  Respiratory: Negative for shortness of breath.   Cardiovascular: Negative for chest pain.  Gastrointestinal: Negative for abdominal pain, constipation, diarrhea, nausea and vomiting.    Physical Exam BP (!) 149/73   Pulse 92   Temp 97.8 F (36.6 C) (Oral)   Ht 5\' 4"  (1.626 m)   Wt 153 lb 3.2 oz (69.5 kg)   SpO2 98%   BMI 26.30 kg/m  CONSTITUTIONAL: No acute distress HEENT:  Normocephalic, atraumatic, extraocular motion intact. RESPIRATORY:  Normal respiratory effort without pathologic use of accessory muscles. CARDIOVASCULAR:  Regular rhythm and rate. GI: The abdomen is soft, non-distended, non-tender.  Her right inguinal hernia is less noticeable today, but there's still one palpable when she strains/coughs.  No hernia palpable on the left.  She has diastasis in the midline, but no palpable umbilical hernia associated with it.  NEUROLOGIC:  Motor and sensation is grossly normal.  Cranial nerves are grossly intact. PSYCH:  Alert and oriented to person, place and time. Affect is normal.  Labs/Imaging: None recently  Assessment and Plan: This is a 70 y.o. female with a right inguinal hernia.  --Discussed with the patient again the options of conservative management vs surgical repair.  She understands that the hernia could potentially get bigger or  more symptomatic, and would not want to wait until then to do the repair.  She would like to proceed.  Discussed the robotic approach with robotic assisted right inguinal hernia repair.  She understands that we can evaluate the left side as well and if there is a hernia, could repair it at the same time.  Reviewed with her the risks of bleeding, infection, injury to surrounding structures, and she's willing to proceed.  Explained to her post-op activity restrictions and recovery, as well as pre-op  COVID-19 testing.  She reports she had COVID in late November and tested positive on 10/02/20, but there's no documentation in our records. --will schedule her for 01/07/21.  Will send medical clearance.  Discussed that her last dose of Aspirin would be on 01/01/21.  Face-to-face time spent with the patient and care providers was 25 minutes, with more than 50% of the time spent counseling, educating, and coordinating care of the patient.     Melvyn Neth, [facility]

## 2020-12-27 NOTE — Progress Notes (Signed)
12/27/2020  History of Present Illness: Caitlyn Baker is a 70 y.o. female presenting for follow up of a right inguinal hernia.  She was last seen on 11/11/20 for initial assessment, and the patient wanted to wait to schedule surgery until March.  She reports that in the interim, she continues to feel the bulging sensation in the right groin, although she does not have significant symptoms like pain or burning anymore.  She does notice the bulging when she's doing activity.  Denies any issues on the left groin.    Past Medical History: Past Medical History:  Diagnosis Date  . Hyperlipidemia      Past Surgical History: Past Surgical History:  Procedure Laterality Date  . MOUTH SURGERY      Home Medications: Prior to Admission medications   Medication Sig Start Date End Date Taking? Authorizing Provider  alendronate (FOSAMAX) 70 MG tablet Take 1 tablet (70 mg total) by mouth every 7 (seven) days. Take with a full glass of water on an empty stomach. 07/16/20  Yes Birdie Sons, MD  aspirin 81 MG tablet Take 81 mg by mouth daily.   Yes [provider]  Calcium Carbonate-Vitamin D 600-400 MG-UNIT tablet Take 1 tablet by mouth daily.   Yes [provider]  Cholecalciferol (VITAMIN D PO) Take 1 tablet by mouth daily. 1000 units   Yes [provider]  Coenzyme Q10 (COQ10 PO) Take by mouth daily.    Yes [provider]  lovastatin (MEVACOR) 20 MG tablet TAKE 1 TABLET BY MOUTH EVERY DAY 11/28/20  Yes Birdie Sons, MD  Magnesium 250 MG TABS Take by mouth.   Yes [provider]  metoprolol succinate (TOPROL-XL) 25 MG 24 hr tablet TAKE 1/2 TABLET BY MOUTH EVERY DAY 09/10/20  Yes Birdie Sons, MD  MULTIPLE VITAMIN PO Take by mouth daily.    Yes [provider]    Allergies: Allergies  Allergen Reactions  . Codeine Nausea Only and Nausea And Vomiting  . Prednisone Other (See Comments)    Other reaction(s): Chest pain Severe anxiety and  depression. Suicidal Severe anxiety and depression.    Review of Systems: Review of Systems  Constitutional: Negative for chills and fever.  Respiratory: Negative for shortness of breath.   Cardiovascular: Negative for chest pain.  Gastrointestinal: Negative for abdominal pain, constipation, diarrhea, nausea and vomiting.    Physical Exam BP (!) 149/73   Pulse 92   Temp 97.8 F (36.6 C) (Oral)   Ht 5\' 4"  (1.626 m)   Wt 153 lb 3.2 oz (69.5 kg)   SpO2 98%   BMI 26.30 kg/m  CONSTITUTIONAL: No acute distress HEENT:  Normocephalic, atraumatic, extraocular motion intact. RESPIRATORY:  Normal respiratory effort without pathologic use of accessory muscles. CARDIOVASCULAR:  Regular rhythm and rate. GI: The abdomen is soft, non-distended, non-tender.  Her right inguinal hernia is less noticeable today, but there's still one palpable when she strains/coughs.  No hernia palpable on the left.  She has diastasis in the midline, but no palpable umbilical hernia associated with it.  NEUROLOGIC:  Motor and sensation is grossly normal.  Cranial nerves are grossly intact. PSYCH:  Alert and oriented to person, place and time. Affect is normal.  Labs/Imaging: None recently  Assessment and Plan: This is a 70 y.o. female with a right inguinal hernia.  --Discussed with the patient again the options of conservative management vs surgical repair.  She understands that the hernia could potentially get bigger or  more symptomatic, and would not want to wait until then to do the repair.  She would like to proceed.  Discussed the robotic approach with robotic assisted right inguinal hernia repair.  She understands that we can evaluate the left side as well and if there is a hernia, could repair it at the same time.  Reviewed with her the risks of bleeding, infection, injury to surrounding structures, and she's willing to proceed.  Explained to her post-op activity restrictions and recovery, as well as pre-op  COVID-19 testing.  She reports she had COVID in late November and tested positive on 10/02/20, but there's no documentation in our records. --will schedule her for 01/07/21.  Will send medical clearance.  Discussed that her last dose of Aspirin would be on 01/01/21.  Face-to-face time spent with the patient and care providers was 25 minutes, with more than 50% of the time spent counseling, educating, and coordinating care of the patient.     Melvyn Neth, [facility]

## 2020-12-30 ENCOUNTER — Telehealth: Payer: Self-pay | Admitting: Family Medicine

## 2020-12-30 ENCOUNTER — Telehealth: Payer: Self-pay | Admitting: Surgery

## 2020-12-30 NOTE — Telephone Encounter (Signed)
This patient needs to be scheduled for pre-op exam this week. She is scheduled for hernia surgery next week. She can have any same day slots if that's all that is available on my schedule. Thanks.

## 2020-12-30 NOTE — Telephone Encounter (Signed)
Outgoing call made, left message for patient to call.  Please advise patient of Pre-Admission date/time, COVID Testing date and Surgery date.  Surgery Date: 01/07/21 Preadmission Testing Date: 12/31/20 (phone 8a-1p) Covid Testing Date: 01/03/21 - patient advised to go to the [facility] ([place]) between 8a-1p  Also patient to call at [phone] 1-3:00pm the day before surgery, to find out what time to arrive for surgery.

## 2020-12-30 NOTE — Telephone Encounter (Signed)
Received call back from patient.  She is made aware of all dates regarding her surgery and voices understanding.

## 2020-12-31 ENCOUNTER — Encounter
Admission: RE | Admit: 2020-12-31 | Discharge: 2020-12-31 | Disposition: A | Discharge status: Home / Self Care | Payer: Medicare Other | Source: Ambulatory Visit | Attending: Surgery | Admitting: Surgery

## 2020-12-31 ENCOUNTER — Other Ambulatory Visit: Payer: Self-pay

## 2020-12-31 DIAGNOSIS — R002 Palpitations: Secondary | ICD-10-CM | POA: Insufficient documentation

## 2020-12-31 DIAGNOSIS — Z01818 Encounter for other preprocedural examination: Secondary | ICD-10-CM | POA: Diagnosis not present

## 2020-12-31 DIAGNOSIS — Z01810 Encounter for preprocedural cardiovascular examination: Secondary | ICD-10-CM | POA: Diagnosis not present

## 2020-12-31 HISTORY — DX: Age-related osteoporosis without current pathological fracture: M81.0

## 2020-12-31 HISTORY — DX: Nausea with vomiting, unspecified: R11.2

## 2020-12-31 HISTORY — DX: Nausea with vomiting, unspecified: Z98.890

## 2020-12-31 LAB — CBC
HCT: 38.3 % (ref 36.0–46.0)
Hemoglobin: 13 g/dL (ref 12.0–15.0)
MCH: 32.4 pg (ref 26.0–34.0)
MCHC: 33.9 g/dL (ref 30.0–36.0)
MCV: 95.5 fL (ref 80.0–100.0)
Platelets: 304 10*3/uL (ref 150–400)
RBC: 4.01 MIL/uL (ref 3.87–5.11)
RDW: 12.4 % (ref 11.5–15.5)
WBC: 6.3 10*3/uL (ref 4.0–10.5)
nRBC: 0 % (ref 0.0–0.2)

## 2020-12-31 LAB — BASIC METABOLIC PANEL
Anion gap: 8 (ref 5–15)
BUN: 12 mg/dL (ref 8–23)
CO2: 26 mmol/L (ref 22–32)
Calcium: 10.6 mg/dL — ABNORMAL HIGH (ref 8.9–10.3)
Chloride: 105 mmol/L (ref 98–111)
Creatinine, Ser: 0.56 mg/dL (ref 0.44–1.00)
GFR, Estimated: >60 mL/min (ref >60)
Glucose, Bld: 102 mg/dL — ABNORMAL HIGH (ref 70–99)
Potassium: 3.8 mmol/L (ref 3.5–5.1)
Sodium: 139 mmol/L (ref 135–145)

## 2020-12-31 NOTE — Pre-Procedure Instructions (Signed)
EKG performed today, similar findings as previous

## 2020-12-31 NOTE — Patient Instructions (Addendum)
Your procedure is scheduled on: Tuesday, March 8 Report to the Registration Desk on the 1st floor of the [facility]. To find out your arrival time, please call [phone] between 1PM - 3PM on: Monday, March 7  REMEMBER: Instructions that are not followed completely may result in serious medical risk, up to and including death; or upon the discretion of your surgeon and anesthesiologist your surgery may need to be rescheduled.  Do not eat food after midnight the night before surgery.  No gum chewing, lozengers or hard candies.  You may however, drink CLEAR liquids up to 2 hours before you are scheduled to arrive for your surgery. Do not drink anything within 2 hours of your scheduled arrival time.  Clear liquids include: - water  - apple juice without pulp - gatorade (not RED, PURPLE, OR BLUE) - black coffee or tea (Do NOT add milk or creamers to the coffee or tea) Do NOT drink anything that is not on this list.  DO NOT TAKE ANY MEDICATIONS THE MORNING OF SURGERY   Follow recommendations from Cardiologist, Pulmonologist or PCP regarding stopping Aspirin. Last day to take is March 2; resume after surgery as instructed by your surgeon.  One week prior to surgery: starting today, March 1 Stop Anti-inflammatories (NSAIDS) such as Advil, Aleve, Ibuprofen, Motrin, Naproxen, Naprosyn and Aspirin based products such as Excedrin, Goodys Powder, BC Powder. Stop ANY OVER THE COUNTER supplements until after surgery.  No Alcohol for 24 hours before or after surgery.  No Smoking including e-cigarettes for 24 hours prior to surgery.  No chewable tobacco products for at least 6 hours prior to surgery.  No nicotine patches on the day of surgery.  Do not use any "recreational" drugs for at least a week prior to your surgery.  Please be advised that the combination of cocaine and anesthesia may have negative outcomes, up to and including death. If you test positive for cocaine, your surgery will  be cancelled.  On the morning of surgery brush your teeth with toothpaste and water, you may rinse your mouth with mouthwash if you wish. Do not swallow any toothpaste or mouthwash.  Do not wear jewelry, make-up, hairpins, clips or nail polish.  Do not wear lotions, powders, or perfumes.   Do not shave body from the neck down 48 hours prior to surgery just in case you cut yourself which could leave a site for infection.  Also, freshly shaved skin may become irritated if using the CHG soap.  Contact lenses, hearing aids and dentures may not be worn into surgery.  Do not bring valuables to the hospital. [facility] is not responsible for any missing/lost belongings or valuables.   Use CHG Soap as directed on instruction sheet.   Notify your doctor if there is any change in your medical condition (cold, fever, infection).  Wear comfortable clothing (specific to your surgery type) to the hospital.  Plan for stool softeners for home use; pain medications have a tendency to cause constipation. You can also help prevent constipation by eating foods high in fiber such as fruits and vegetables and drinking plenty of fluids as your diet allows.  After surgery, you can help prevent lung complications by doing breathing exercises.  Take deep breaths and cough every 1-2 hours. Your doctor may order a device called an Incentive Spirometer to help you take deep breaths. When coughing or sneezing, hold a pillow firmly against your incision with both hands. This is called "splinting." Doing this  helps protect your incision. It also decreases belly discomfort.  If you are being discharged the day of surgery, you will not be allowed to drive home. You will need a responsible adult (18 years or older) to drive you home and stay with you that night.   If you are taking public transportation, you will need to have a responsible adult (18 years or older) with you. Please confirm with your physician that it  is acceptable to use public transportation.   Please call the [place] Dept. at [phone] if you have any questions about these instructions.  Surgery Visitation Policy:  Patients undergoing a surgery or procedure may have one family member or support person with them as long as that person is not COVID-19 positive or experiencing its symptoms.  That person may remain in the waiting area during the procedure.

## 2021-01-01 ENCOUNTER — Encounter: Payer: Self-pay | Admitting: Family Medicine

## 2021-01-01 ENCOUNTER — Ambulatory Visit (INDEPENDENT_AMBULATORY_CARE_PROVIDER_SITE_OTHER): Payer: Medicare Other | Admitting: Family Medicine

## 2021-01-01 VITALS — BP 138/73 | Temp 97.0°F | Resp 18 | Wt 155.0 lb

## 2021-01-01 DIAGNOSIS — Z01818 Encounter for other preprocedural examination: Secondary | ICD-10-CM | POA: Diagnosis not present

## 2021-01-01 NOTE — Progress Notes (Signed)
Established patient visit   Patient: Caitlyn Baker   DOB: Oct 08, 1951   70 y.o. Female  MRN: [id number] Visit Date: 01/01/2021  Today's healthcare provider: Lelon Huh, MD   Chief Complaint  Patient presents with  . Medical Clearance   Subjective    HPI  Surgical clearance: Patient is here for a surgical clearance. She is scheduled to have an inguinal hernia repair on 01/07/2021. Surgeon will be Dr. Hampton Abbot. Patient will undergo general anesthesia. She has never had surgery or general anesthesia but has not family history of surgical or anesthesia complications. She feels well. No dyspnea, chest pain or pressure or palpitations     Medications: Outpatient Medications Prior to Visit  Medication Sig  . alendronate (FOSAMAX) 70 MG tablet Take 1 tablet (70 mg total) by mouth every 7 (seven) days. Take with a full glass of water on an empty stomach. (Patient taking differently: Take 70 mg by mouth every 7 (seven) days. Take with a full glass of water on an empty stomach. Thursday)  . aspirin 81 MG tablet Take 81 mg by mouth daily.  . Calcium Carbonate-Vitamin D 600-400 MG-UNIT tablet Take 1 tablet by mouth daily.  . cholecalciferol (VITAMIN D3) 25 MCG (1000 UNIT) tablet Take 1,000 Units by mouth daily.  . Coenzyme Q10 (COQ10 PO) Take 1 capsule by mouth daily.  Marland Kitchen lovastatin (MEVACOR) 20 MG tablet TAKE 1 TABLET BY MOUTH EVERY DAY (Patient taking differently: Take 20 mg by mouth at bedtime.)  . Magnesium 250 MG TABS Take 250 mg by mouth daily.  . metoprolol succinate (TOPROL-XL) 25 MG 24 hr tablet TAKE 1/2 TABLET BY MOUTH EVERY DAY (Patient taking differently: Take 12.5 mg by mouth at bedtime.)  . MULTIPLE VITAMIN PO Take 1 tablet by mouth daily.   No facility-administered medications prior to visit.    Review of Systems  Constitutional: Negative for appetite change, chills, fatigue and fever.  Respiratory: Negative for chest tightness and shortness of breath.    Cardiovascular: Negative for chest pain and palpitations.  Gastrointestinal: Negative for abdominal pain, nausea and vomiting.  Neurological: Negative for dizziness and weakness.    Last CBC Lab Results  Component Value Date   WBC 6.3 12/31/2020   HGB 13.0 12/31/2020   HCT 38.3 12/31/2020   MCV 95.5 12/31/2020   MCH 32.4 12/31/2020   RDW 12.4 12/31/2020   PLT 304 70/96/2836   Last metabolic panel Lab Results  Component Value Date   GLUCOSE 102 (H) 12/31/2020   NA 139 12/31/2020   K 3.8 12/31/2020   CL 105 12/31/2020   CO2 26 12/31/2020   BUN 12 12/31/2020   CREATININE 0.56 12/31/2020   GFRNONAA >60 12/31/2020   GFRAA 102 07/17/2020   CALCIUM 10.6 (H) 12/31/2020   PHOS 2.6 (L) 07/17/2020   PROT 6.8 06/28/2019   ALBUMIN 4.5 07/17/2020   LABGLOB 2.3 06/28/2019   AGRATIO 2.0 06/28/2019   BILITOT 0.4 06/28/2019   ALKPHOS 75 06/28/2019   AST 15 06/28/2019   ALT 14 06/28/2019   ANIONGAP 8 12/31/2020       Objective    BP 138/73 (BP Location: Left Arm, Patient Position: Sitting, Cuff Size: Normal)   Temp (!) 97 F (36.1 C) (Temporal)   Resp 18   Wt 155 lb (70.3 kg)   BMI 26.61 kg/m     Physical Exam   General: Appearance:     Overweight female in no acute distress  Eyes:  PERRL, conjunctiva/corneas clear, EOM's intact       Lungs:     Clear to auscultation bilaterally, respirations unlabored  Heart:    Normal heart rate. Normal rhythm. No murmurs, rubs, or gallops. Normal pulses  MS:   All extremities are intact.   Neurologic:   Awake, alert, oriented x 3. No apparent focal neurological           defect.         Assessment & Plan     1. Preop examination Reviewed pre-op labs and EKG with no concerning findings. Patient has no absolute or relative contraindications to planned surgery and anesthesia including general anesthesia. She is at low risk for cardiac and pulmonary complications.          The entirety of the information documented in the  History of Present Illness, Review of Systems and Physical Exam were personally obtained by me. Portions of this information were initially documented by the CMA and reviewed by me for thoroughness and accuracy.      Lelon Huh, MD  [facility] [phone] (phone) [phone] (fax)  [place]

## 2021-01-01 NOTE — Progress Notes (Signed)
Medical Clearance has been received from Dr Maralyn Sago office. The patient is cleared at Low risk for surgery. She will hold her aspirin until after surgery. The patient is aware.

## 2021-01-03 ENCOUNTER — Other Ambulatory Visit: Payer: Self-pay

## 2021-01-03 ENCOUNTER — Other Ambulatory Visit
Admission: RE | Admit: 2021-01-03 | Discharge: 2021-01-03 | Disposition: A | Discharge status: Home / Self Care | Payer: Medicare Other | Source: Ambulatory Visit | Attending: Surgery | Admitting: Surgery

## 2021-01-03 DIAGNOSIS — Z01812 Encounter for preprocedural laboratory examination: Secondary | ICD-10-CM | POA: Insufficient documentation

## 2021-01-03 DIAGNOSIS — Z20822 Contact with and (suspected) exposure to covid-19: Secondary | ICD-10-CM | POA: Insufficient documentation

## 2021-01-03 LAB — SARS CORONAVIRUS 2 (TAT 6-24 HRS): SARS Coronavirus 2: NEGATIVE

## 2021-01-07 ENCOUNTER — Encounter
Admission: RE | Disposition: A | Discharge status: Home / Self Care | Payer: Self-pay | Source: Home / Self Care | Attending: Surgery

## 2021-01-07 ENCOUNTER — Ambulatory Visit
Admission: RE | Admit: 2021-01-07 | Discharge: 2021-01-07 | Disposition: A | Discharge status: Home / Self Care | Payer: Medicare Other | Attending: Surgery | Admitting: Surgery

## 2021-01-07 ENCOUNTER — Ambulatory Visit: Payer: Medicare Other | Admitting: Certified Registered Nurse Anesthetist

## 2021-01-07 ENCOUNTER — Other Ambulatory Visit: Payer: Self-pay

## 2021-01-07 ENCOUNTER — Encounter: Payer: Self-pay | Admitting: Surgery

## 2021-01-07 DIAGNOSIS — K4090 Unilateral inguinal hernia, without obstruction or gangrene, not specified as recurrent: Secondary | ICD-10-CM

## 2021-01-07 DIAGNOSIS — Z79899 Other long term (current) drug therapy: Secondary | ICD-10-CM | POA: Insufficient documentation

## 2021-01-07 DIAGNOSIS — Z888 Allergy status to other drugs, medicaments and biological substances status: Secondary | ICD-10-CM | POA: Diagnosis not present

## 2021-01-07 DIAGNOSIS — Z7983 Long term (current) use of bisphosphonates: Secondary | ICD-10-CM | POA: Insufficient documentation

## 2021-01-07 DIAGNOSIS — Z885 Allergy status to narcotic agent status: Secondary | ICD-10-CM | POA: Diagnosis not present

## 2021-01-07 DIAGNOSIS — Z7982 Long term (current) use of aspirin: Secondary | ICD-10-CM | POA: Diagnosis not present

## 2021-01-07 DIAGNOSIS — K4020 Bilateral inguinal hernia, without obstruction or gangrene, not specified as recurrent: Secondary | ICD-10-CM | POA: Insufficient documentation

## 2021-01-07 HISTORY — PX: XI ROBOTIC ASSISTED INGUINAL HERNIA REPAIR WITH MESH: SHX6706

## 2021-01-07 SURGERY — REPAIR, HERNIA, INGUINAL, ROBOT-ASSISTED, LAPAROSCOPIC, USING MESH
Anesthesia: General | Laterality: Bilateral

## 2021-01-07 MED ORDER — GABAPENTIN 100 MG PO CAPS
ORAL_CAPSULE | ORAL | Status: AC
  Administered 2021-01-07: 100 mg via ORAL
  Filled 2021-01-07: qty 1

## 2021-01-07 MED ORDER — ORAL CARE MOUTH RINSE: 15.0000 mL | Freq: Once | OROMUCOSAL | Status: AC

## 2021-01-07 MED ORDER — SCOPOLAMINE 1 MG/3DAYS TD PT72: 1.0000 | MEDICATED_PATCH | Freq: Once | TRANSDERMAL | Status: DC

## 2021-01-07 MED ORDER — FAMOTIDINE 20 MG PO TABS: 20.0000 mg | ORAL_TABLET | Freq: Once | ORAL | Status: AC

## 2021-01-07 MED ORDER — ONDANSETRON HCL 4 MG/2ML IJ SOLN
INTRAMUSCULAR | Status: AC
  Filled 2021-01-07: qty 2

## 2021-01-07 MED ORDER — ONDANSETRON HCL 4 MG/2ML IJ SOLN
INTRAMUSCULAR | Status: DC | PRN
  Administered 2021-01-07: 4 mg via INTRAVENOUS

## 2021-01-07 MED ORDER — FENTANYL CITRATE (PF) 100 MCG/2ML IJ SOLN
INTRAMUSCULAR | Status: AC
  Administered 2021-01-07: 50 ug via INTRAVENOUS
  Filled 2021-01-07: qty 2

## 2021-01-07 MED ORDER — ACETAMINOPHEN 500 MG PO TABS: 1000.0000 mg | ORAL_TABLET | ORAL | Status: AC

## 2021-01-07 MED ORDER — CEFAZOLIN SODIUM-DEXTROSE 1-4 GM/50ML-% IV SOLN
INTRAVENOUS | Status: AC
  Filled 2021-01-07: qty 50

## 2021-01-07 MED ORDER — FENTANYL CITRATE (PF) 100 MCG/2ML IJ SOLN
25.0000 ug | INTRAMUSCULAR | Status: DC | PRN
  Administered 2021-01-07: 50 ug via INTRAVENOUS

## 2021-01-07 MED ORDER — KETOROLAC TROMETHAMINE 30 MG/ML IJ SOLN
INTRAMUSCULAR | Status: AC
  Filled 2021-01-07: qty 1

## 2021-01-07 MED ORDER — SCOPOLAMINE 1 MG/3DAYS TD PT72
MEDICATED_PATCH | TRANSDERMAL | Status: AC
  Administered 2021-01-07: 1.5 mg via TRANSDERMAL
  Filled 2021-01-07: qty 1

## 2021-01-07 MED ORDER — GABAPENTIN 100 MG PO CAPS: 100.0000 mg | ORAL_CAPSULE | ORAL | Status: AC

## 2021-01-07 MED ORDER — LIDOCAINE HCL (CARDIAC) PF 100 MG/5ML IV SOSY
PREFILLED_SYRINGE | INTRAVENOUS | Status: DC | PRN
  Administered 2021-01-07: 50 mg via INTRAVENOUS

## 2021-01-07 MED ORDER — BUPIVACAINE LIPOSOME 1.3 % IJ SUSP: 20.0000 mL | Freq: Once | INTRAMUSCULAR | Status: DC

## 2021-01-07 MED ORDER — KETOROLAC TROMETHAMINE 30 MG/ML IJ SOLN
INTRAMUSCULAR | Status: DC | PRN
  Administered 2021-01-07: 30 mg via INTRAVENOUS

## 2021-01-07 MED ORDER — FENTANYL CITRATE (PF) 100 MCG/2ML IJ SOLN
INTRAMUSCULAR | Status: AC
  Filled 2021-01-07: qty 2

## 2021-01-07 MED ORDER — CHLORHEXIDINE GLUCONATE CLOTH 2 % EX PADS: 6.0000 | MEDICATED_PAD | Freq: Once | CUTANEOUS | Status: DC

## 2021-01-07 MED ORDER — OXYCODONE HCL 5 MG PO TABS: 5.0000 mg | ORAL_TABLET | Freq: Once | ORAL | Status: AC | PRN

## 2021-01-07 MED ORDER — IBUPROFEN 600 MG PO TABS: 600.0000 mg | ORAL_TABLET | Freq: Three times a day (TID) | ORAL | 1 refills | Status: DC | PRN

## 2021-01-07 MED ORDER — BUPIVACAINE-EPINEPHRINE (PF) 0.25% -1:200000 IJ SOLN
INTRAMUSCULAR | Status: AC
  Filled 2021-01-07: qty 30

## 2021-01-07 MED ORDER — OXYCODONE HCL 5 MG PO TABS
ORAL_TABLET | ORAL | Status: AC
  Administered 2021-01-07: 5 mg via ORAL
  Filled 2021-01-07: qty 1

## 2021-01-07 MED ORDER — CHLORHEXIDINE GLUCONATE 0.12 % MT SOLN: 15.0000 mL | Freq: Once | OROMUCOSAL | Status: AC

## 2021-01-07 MED ORDER — FENTANYL CITRATE (PF) 100 MCG/2ML IJ SOLN
INTRAMUSCULAR | Status: DC | PRN
  Administered 2021-01-07 (×2): 50 ug via INTRAVENOUS

## 2021-01-07 MED ORDER — SUGAMMADEX SODIUM 200 MG/2ML IV SOLN
INTRAVENOUS | Status: DC | PRN
  Administered 2021-01-07: 200 mg via INTRAVENOUS

## 2021-01-07 MED ORDER — BUPIVACAINE LIPOSOME 1.3 % IJ SUSP
INTRAMUSCULAR | Status: AC
  Filled 2021-01-07: qty 20

## 2021-01-07 MED ORDER — OXYCODONE HCL 5 MG PO TABS: 5.0000 mg | ORAL_TABLET | ORAL | 0 refills | Status: DC | PRN

## 2021-01-07 MED ORDER — ACETAMINOPHEN 500 MG PO TABS
ORAL_TABLET | ORAL | Status: AC
  Administered 2021-01-07: 1000 mg via ORAL
  Filled 2021-01-07: qty 2

## 2021-01-07 MED ORDER — LACTATED RINGERS IV SOLN: INTRAVENOUS | Status: DC

## 2021-01-07 MED ORDER — ROCURONIUM BROMIDE 100 MG/10ML IV SOLN
INTRAVENOUS | Status: DC | PRN
  Administered 2021-01-07: 50 mg via INTRAVENOUS
  Administered 2021-01-07: 10 mg via INTRAVENOUS
  Administered 2021-01-07: 20 mg via INTRAVENOUS

## 2021-01-07 MED ORDER — ROCURONIUM BROMIDE 10 MG/ML (PF) SYRINGE
PREFILLED_SYRINGE | INTRAVENOUS | Status: AC
  Filled 2021-01-07: qty 10

## 2021-01-07 MED ORDER — FENTANYL CITRATE (PF) 100 MCG/2ML IJ SOLN
25.0000 ug | INTRAMUSCULAR | Status: AC | PRN
  Administered 2021-01-07 (×2): 25 ug via INTRAVENOUS

## 2021-01-07 MED ORDER — BUPIVACAINE-EPINEPHRINE 0.25% -1:200000 IJ SOLN
INTRAMUSCULAR | Status: DC | PRN
  Administered 2021-01-07: 30 mL

## 2021-01-07 MED ORDER — CHLORHEXIDINE GLUCONATE CLOTH 2 % EX PADS
6.0000 | MEDICATED_PAD | Freq: Once | CUTANEOUS | Status: AC
  Administered 2021-01-07: 6 via TOPICAL

## 2021-01-07 MED ORDER — ONDANSETRON HCL 4 MG/2ML IJ SOLN
4.0000 mg | Freq: Once | INTRAMUSCULAR | Status: AC | PRN
  Administered 2021-01-07: 4 mg via INTRAVENOUS

## 2021-01-07 MED ORDER — PHENYLEPHRINE HCL (PRESSORS) 10 MG/ML IV SOLN
INTRAVENOUS | Status: DC | PRN
  Administered 2021-01-07: 200 ug via INTRAVENOUS
  Administered 2021-01-07: 100 ug via INTRAVENOUS
  Administered 2021-01-07 (×2): 200 ug via INTRAVENOUS
  Administered 2021-01-07: 100 ug via INTRAVENOUS

## 2021-01-07 MED ORDER — DEXAMETHASONE SODIUM PHOSPHATE 10 MG/ML IJ SOLN
INTRAMUSCULAR | Status: AC
  Filled 2021-01-07: qty 1

## 2021-01-07 MED ORDER — BUPIVACAINE LIPOSOME 1.3 % IJ SUSP
INTRAMUSCULAR | Status: DC | PRN
  Administered 2021-01-07: 20 mL

## 2021-01-07 MED ORDER — LIDOCAINE HCL (PF) 2 % IJ SOLN
INTRAMUSCULAR | Status: AC
  Filled 2021-01-07: qty 5

## 2021-01-07 MED ORDER — PROPOFOL 10 MG/ML IV BOLUS
INTRAVENOUS | Status: AC
  Filled 2021-01-07: qty 20

## 2021-01-07 MED ORDER — FAMOTIDINE 20 MG PO TABS
ORAL_TABLET | ORAL | Status: AC
  Administered 2021-01-07: 20 mg via ORAL
  Filled 2021-01-07: qty 1

## 2021-01-07 MED ORDER — OXYCODONE HCL 5 MG/5ML PO SOLN: 5.0000 mg | Freq: Once | ORAL | Status: AC | PRN

## 2021-01-07 MED ORDER — CEFAZOLIN SODIUM-DEXTROSE 2-4 GM/100ML-% IV SOLN
2.0000 g | INTRAVENOUS | Status: AC
  Administered 2021-01-07: 2 g via INTRAVENOUS

## 2021-01-07 MED ORDER — CHLORHEXIDINE GLUCONATE 0.12 % MT SOLN
OROMUCOSAL | Status: AC
  Administered 2021-01-07: 15 mL via OROMUCOSAL
  Filled 2021-01-07: qty 15

## 2021-01-07 MED ORDER — DEXAMETHASONE SODIUM PHOSPHATE 10 MG/ML IJ SOLN
INTRAMUSCULAR | Status: DC | PRN
  Administered 2021-01-07: 5 mg via INTRAVENOUS

## 2021-01-07 MED ORDER — PROPOFOL 10 MG/ML IV BOLUS
INTRAVENOUS | Status: DC | PRN
  Administered 2021-01-07: 120 mg via INTRAVENOUS

## 2021-01-07 SURGICAL SUPPLY — 58 items
ADH SKN CLS APL DERMABOND .7 (GAUZE/BANDAGES/DRESSINGS) ×1
APL PRP STRL LF DISP 70% ISPRP (MISCELLANEOUS) ×1
CANISTER SUCT 1200ML W/VALVE (MISCELLANEOUS) IMPLANT
CANNULA REDUC XI 12-8 STAPL (CANNULA) ×1
CANNULA REDUCER 12-8 DVNC XI (CANNULA) ×1 IMPLANT
CHLORAPREP W/TINT 26 (MISCELLANEOUS) ×2 IMPLANT
COVER TIP SHEARS 8 DVNC (MISCELLANEOUS) ×1 IMPLANT
COVER TIP SHEARS 8MM DA VINCI (MISCELLANEOUS) ×1
COVER WAND RF STERILE (DRAPES) ×2 IMPLANT
DEFOGGER SCOPE WARMER CLEARIFY (MISCELLANEOUS) ×2 IMPLANT
DERMABOND ADVANCED (GAUZE/BANDAGES/DRESSINGS) ×1
DERMABOND ADVANCED .7 DNX12 (GAUZE/BANDAGES/DRESSINGS) ×1 IMPLANT
DRAPE ARM DVNC X/XI (DISPOSABLE) ×4 IMPLANT
DRAPE COLUMN DVNC XI (DISPOSABLE) ×1 IMPLANT
DRAPE DA VINCI XI ARM (DISPOSABLE) ×4
DRAPE DA VINCI XI COLUMN (DISPOSABLE) ×1
ELECT CAUTERY BLADE 6.4 (BLADE) ×2 IMPLANT
ELECT REM PT RETURN 9FT ADLT (ELECTROSURGICAL) ×2
ELECTRODE REM PT RTRN 9FT ADLT (ELECTROSURGICAL) ×1 IMPLANT
GLOVE SURG SYN 7.0 (GLOVE) ×4 IMPLANT
GLOVE SURG SYN 7.5  E (GLOVE) ×2
GLOVE SURG SYN 7.5 E (GLOVE) ×2 IMPLANT
GOWN STRL REUS W/ TWL LRG LVL3 (GOWN DISPOSABLE) ×4 IMPLANT
GOWN STRL REUS W/TWL LRG LVL3 (GOWN DISPOSABLE) ×8
IRRIGATION STRYKERFLOW (MISCELLANEOUS) IMPLANT
IRRIGATOR STRYKERFLOW (MISCELLANEOUS)
IV NS 1000ML (IV SOLUTION)
IV NS 1000ML BAXH (IV SOLUTION) IMPLANT
KIT PINK PAD W/HEAD ARE REST (MISCELLANEOUS) ×2
KIT PINK PAD W/HEAD ARM REST (MISCELLANEOUS) ×1 IMPLANT
LABEL OR SOLS (LABEL) ×2 IMPLANT
MANIFOLD NEPTUNE II (INSTRUMENTS) ×2 IMPLANT
MESH 3DMAX 4X6 LT LRG (Mesh General) ×1 IMPLANT
MESH 3DMAX 4X6 RT LRG (Mesh General) ×1 IMPLANT
MESH 3DMAX MID 4X6 LT LRG (Mesh General) ×1 IMPLANT
MESH 3DMAX MID 4X6 RT LRG (Mesh General) ×1 IMPLANT
NEEDLE HYPO 22GX1.5 SAFETY (NEEDLE) ×2 IMPLANT
NEEDLE INSUFFLATION 14GA 120MM (NEEDLE) ×2 IMPLANT
OBTURATOR OPTICAL STANDARD 8MM (TROCAR) ×1
OBTURATOR OPTICAL STND 8 DVNC (TROCAR) ×1
OBTURATOR OPTICALSTD 8 DVNC (TROCAR) ×1 IMPLANT
PACK LAP CHOLECYSTECTOMY (MISCELLANEOUS) ×2 IMPLANT
PENCIL ELECTRO HAND CTR (MISCELLANEOUS) ×2 IMPLANT
SEAL CANN UNIV 5-8 DVNC XI (MISCELLANEOUS) ×3 IMPLANT
SEAL XI 5MM-8MM UNIVERSAL (MISCELLANEOUS) ×3
SET TUBE SMOKE EVAC HIGH FLOW (TUBING) ×2 IMPLANT
SOLUTION ELECTROLUBE (MISCELLANEOUS) ×2 IMPLANT
SPONGE LAP 18X18 RF (DISPOSABLE) ×2 IMPLANT
STAPLER CANNULA SEAL DVNC XI (STAPLE) ×1 IMPLANT
STAPLER CANNULA SEAL XI (STAPLE) ×1
SUT MNCRL AB 4-0 PS2 18 (SUTURE) ×2 IMPLANT
SUT VIC AB 2-0 SH 27 (SUTURE) ×4
SUT VIC AB 2-0 SH 27XBRD (SUTURE) ×2 IMPLANT
SUT VICRYL 0 AB UR-6 (SUTURE) ×4 IMPLANT
SUT VLOC 90 S/L VL9 GS22 (SUTURE) ×4 IMPLANT
TAPE TRANSPORE STRL 2 31045 (GAUZE/BANDAGES/DRESSINGS) ×2 IMPLANT
TRAY FOLEY SLVR 16FR LF STAT (SET/KITS/TRAYS/PACK) ×2 IMPLANT
TROCAR BALLN GELPORT 12X130M (ENDOMECHANICALS) ×2 IMPLANT

## 2021-01-07 NOTE — Anesthesia Preprocedure Evaluation (Addendum)
Anesthesia Evaluation  Patient identified by MRN, date of birth, ID band Patient awake    Reviewed: Allergy & Precautions, H&P , NPO status , Patient's Chart, lab work & pertinent test results  History of Anesthesia Complications (+) PONVNegative for: history of anesthetic complications  Airway Mallampati: II  TM Distance: >3 FB     Dental  (+) Edentulous Lower, Edentulous Upper   Pulmonary neg pulmonary ROS, neg sleep apnea, neg COPD, Patient abstained from smoking.,    breath sounds clear to auscultation       Cardiovascular (-) angina(-) Past MI and (-) Cardiac Stents + dysrhythmias (PVC's)  Rhythm:regular Rate:Normal     Neuro/Psych PSYCHIATRIC DISORDERS Anxiety negative neurological ROS     GI/Hepatic negative GI ROS, Neg liver ROS,   Endo/Other  negative endocrine ROS  Renal/GU      Musculoskeletal   Abdominal   Peds  Hematology negative hematology ROS (+)   Anesthesia Other Findings Past Medical History: No date: Hyperlipidemia No date: Osteoporosis 10/2019: Palpitations No date: PONV (postoperative nausea and vomiting)     Comment:  after colonoscopy 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  Past Surgical History: 2005: COLONOSCOPY 1980's: MOUTH SURGERY     Comment:  Dental work No date: RETINAL LASER PROCEDURE; Right     Comment:  torn retina     Reproductive/Obstetrics negative OB ROS                            Anesthesia Physical Anesthesia Plan  ASA: II  Anesthesia Plan: General ETT   Post-op Pain Management:    Induction:   PONV Risk Score and Plan: Ondansetron, Dexamethasone and Treatment may vary due to age or medical condition  Airway Management Planned:   Additional Equipment:   Intra-op Plan:   Post-operative Plan:   Informed Consent: I have reviewed the patients History and Physical, chart, labs and discussed the procedure including the risks, benefits and  alternatives for the proposed anesthesia with the patient or authorized representative who has indicated his/her understanding and acceptance.     Dental Advisory Given  Plan Discussed with: Anesthesiologist, CRNA and Surgeon  Anesthesia Plan Comments:         Anesthesia Quick Evaluation

## 2021-01-07 NOTE — Discharge Instructions (Signed)
AMBULATORY SURGERY  DISCHARGE INSTRUCTIONS   1) The drugs that you were given will stay in your system until tomorrow so for the next 24 hours you should not:  A) Drive an automobile B) Make any legal decisions C) Drink any alcoholic beverage   2) You may resume regular meals tomorrow.  Today it is better to start with liquids and gradually work up to solid foods.  You may eat anything you prefer, but it is better to start with liquids, then soup and crackers, and gradually work up to solid foods.   3) Please notify your doctor immediately if you have any unusual bleeding, trouble breathing, redness and pain at the surgery site, drainage, fever, or pain not relieved by medication.    4) Additional Instructions:        Please contact your physician with any problems or Same Day Surgery at [phone], Monday through Friday 6 am to 4 pm, or [place] at [facility] number at [phone].  Bupivacaine Liposomal Suspension for Injection   DO NOT REMOVE GREEN ARMBAND FOR 4 DAYS   What is this medicine? BUPIVACAINE LIPOSOMAL (bue PIV a kane LIP oh som al) is an anesthetic. It causes loss of feeling in the skin or other tissues. It is used to prevent and to treat pain from some procedures. This medicine may be used for other purposes; ask your health care provider or pharmacist if you have questions. COMMON BRAND NAME(S): EXPAREL What should I tell my health care provider before I take this medicine? They need to know if you have any of these conditions:  G6PD deficiency  heart disease  kidney disease  liver disease  low blood pressure  lung or breathing disease, like asthma  an unusual or allergic reaction to bupivacaine, other medicines, foods, dyes, or preservatives  pregnant or trying to get pregnant  breast-feeding How should I use this medicine? This medicine is injected into the affected area. It is given by a health care provider in a hospital or  clinic setting. Talk to your health care provider about the use of this medicine in children. While it may be given to children as young as 6 years for selected conditions, precautions do apply. Overdosage: If you think you have taken too much of this medicine contact a poison control center or emergency room at once. NOTE: This medicine is only for you. Do not share this medicine with others. What if I miss a dose? This does not apply. What may interact with this medicine? This medicine may interact with the following medications:  acetaminophen  certain antibiotics like dapsone, nitrofurantoin, aminosalicylic acid, sulfonamides  certain medicines for seizures like phenobarbital, phenytoin, valproic acid  chloroquine  cyclophosphamide  flutamide  hydroxyurea  ifosfamide  metoclopramide  nitric oxide  nitroglycerin  nitroprusside  nitrous oxide  other local anesthetics like lidocaine, pramoxine, tetracaine  primaquine  quinine  rasburicase  sulfasalazine This list may not describe all possible interactions. Give your health care provider a list of all the medicines, herbs, non-prescription drugs, or dietary supplements you use. Also tell them if you smoke, drink alcohol, or use illegal drugs. Some items may interact with your medicine. What should I watch for while using this medicine? Your condition will be monitored carefully while you are receiving this medicine. Be careful to avoid injury while the area is numb, and you are not aware of pain. What side effects may I notice from receiving this medicine? Side effects that you should  report to your doctor or health care professional as soon as possible:  allergic reactions like skin rash, itching or hives, swelling of the face, lips, or tongue  seizures  signs and symptoms of a dangerous change in heartbeat or heart rhythm like chest pain; dizziness; fast, irregular heartbeat; palpitations; feeling faint or  lightheaded; falls; breathing problems  signs and symptoms of methemoglobinemia such as pale, gray, or blue colored skin; headache; fast heartbeat; shortness of breath; feeling faint or lightheaded, falls; tiredness Side effects that usually do not require medical attention (report to your doctor or health care professional if they continue or are bothersome):  anxious  back pain  changes in taste  changes in vision  constipation  dizziness  fever  nausea, vomiting This list may not describe all possible side effects. Call your doctor for medical advice about side effects. You may report side effects to FDA at 1-800-FDA-1088. Where should I keep my medicine? This drug is given in a hospital or clinic and will not be stored at home. NOTE: This sheet is a summary. It may not cover all possible information. If you have questions about this medicine, talk to your doctor, pharmacist, or health care provider.  2021 Elsevier/Gold Standard (2020-01-25 12:24:57)

## 2021-01-07 NOTE — Anesthesia Postprocedure Evaluation (Signed)
Anesthesia Post Note  Patient: Caitlyn Baker  Procedure(s) Performed: XI ROBOTIC ASSISTED INGUINAL HERNIA REPAIR WITH MESH (Bilateral )  Patient location during evaluation: PACU Anesthesia Type: General Level of consciousness: awake and alert Pain management: pain level controlled Vital Signs Assessment: post-procedure vital signs reviewed and stable Respiratory status: spontaneous breathing, nonlabored ventilation, respiratory function stable and patient connected to nasal cannula oxygen Cardiovascular status: blood pressure returned to baseline and stable Postop Assessment: no apparent nausea or vomiting Anesthetic complications: no   No complications documented.   Last Vitals:  Vitals:   01/07/21 1630 01/07/21 1635  BP: 106/67   Pulse: 87 96  Resp: 19 15  Temp:    SpO2: 100% 97%    Last Pain:  Vitals:   01/07/21 1640  PainSc: [place] Dana Debo

## 2021-01-07 NOTE — Anesthesia Procedure Notes (Signed)
Procedure Name: Intubation Performed by: Tollie Eth, CRNA Pre-anesthesia Checklist: Patient identified, Patient being monitored, Timeout performed, Emergency Drugs available and Suction available Patient Re-evaluated:Patient Re-evaluated prior to induction Oxygen Delivery Method: Circle system utilized Preoxygenation: Pre-oxygenation with 100% oxygen Induction Type: IV induction Ventilation: Mask ventilation without difficulty Laryngoscope Size: Mac and 3 Grade View: Grade I Tube type: Oral Tube size: 7.0 mm Number of attempts: 1 Airway Equipment and Method: Stylet and Video-laryngoscopy Placement Confirmation: ETT inserted through vocal cords under direct vision,  positive ETCO2 and breath sounds checked- equal and bilateral Secured at: 21 cm Tube secured with: Tape Dental Injury: Teeth and Oropharynx as per pre-operative assessment

## 2021-01-07 NOTE — Op Note (Signed)
Procedure Date:  01/07/2021  Pre-operative Diagnosis:  Right inguinal hernia  Post-operative Diagnosis: Bilateral inguinal hernias  Procedure: 1.  Robotic assisted Bilateral Inguinal Hernia Repair 2.  Creation of Bilateral Posterior Rectus-Transversalis Fascia Advancment Flap for Coverage of Pelvic Wound (200 cm)  Surgeon:  Melvyn Neth, MD  Anesthesia:  General endotracheal  Estimated Blood Loss:  10 ml  Specimens:  None  Complications:  None  Findings:  The patient bilateral inguinal hernias.  The right side consisted of indirect and direct hernia defects.  The left side also consisted of direct and indirect defects, though much smaller compared to the right side.  Indications for Procedure:  This is a 70 y.o. female who presents with a right inguinal hernia.  The options of surgery versus observation were reviewed with the patient and/or family. The risks of bleeding, abscess or infection, recurrence of symptoms, potential for an open procedure, injury to surrounding structures, and chronic pain were all discussed with the patient and she willing to proceed.  We have planned this transabdominal procedure with the creation of right peritoneal flap based on the posterior rectus sheath and transversalis fascia in order to fully cover the mesh, creating a natural tisssue barrier for the bowel and peritoneal cavity.  Description of Procedure: The patient was correctly identified in the preoperative area and brought into the operating room.  The patient was placed supine with VTE prophylaxis in place.  Appropriate time-outs were performed.  Anesthesia was induced and the patient was intubated.  Foley catheter was placed.  Appropriate antibiotics were infused.  The abdomen was prepped and draped in a sterile fashion. A supraumbilical incision was made. A cutdown technique was used to enter the abdominal cavity without injury, and a Hasson trocar was inserted.  Pneumoperitoneum was  obtained with appropriate opening pressures.  Both inguinal regions were inspected for hernias and it was confirmed that the patient actually had bilateral inguinal hernias.  A Veress needle was used to start dissecting the peritoneal flap on both sides.  Two 8-mm robotic ports were placed in the right and left lateral positions under direct visualization.  A large right and left 3D Max Mid Bard Mesh, a 2-0 Vicryl, two 2-0 v-lock sutures were placed through the umbilical port under direct visualization.  The [employer] platform was docked onto the patient, the camera was inserted and targeted, and the instruments were placed under direct visualization.  Using electocautery, the rightperitoneal and posterior rectus tissue flap was created.  The peritoneum on the right side was scored from the median umbilical ligament laterally towards the ASIS.  The flap was mobilized using robotic scissors and the bipolar instruments, creating a plane along the posterior rectus sheath and transversalis fascia down to the pubic tubercle medially. It was then further mobilized laterally across the inguinal canal and femoral vessels and onto the psoas muscle. The inferior epigastric vessels were identified and preserved. Both direct and indirect hernia sacs were reduced and freed preserving all structures. A lipoma going into the direct space was resected.  This created a right posterior rectus and peritoneal flap measuring roughly 17 cm x 12 cm.     We then proceeded to the left side in similar fashion.  Using electocautery, the left peritoneal and posterior rectus tissue flap was created.  The peritoneum on the left side was scored from the median umbilical ligament laterally towards the ASIS.  The flap was mobilized using robotic scissors and the bipolar instruments, creating a plane along  the posterior rectus sheath and transversalis fascia down to the pubic tubercle medially. It was then further mobilized laterally across the  inguinal canal and femoral vessels and onto the psoas muscle. The inferior epigastric vessels were identified and preserved. Both direct and indirect hernia sacs were reduced and freed preserving all structures. A lipoma in the direct space was resected.  This created a left posterior rectus and peritoneal flap measuring roughly 17 cm x 12 cm.     The right large Bard 3D Max Mid mesh was placed with good overlap along all the potential hernia defects and secured in place with 2-0 Vicryl along the medial superomedial and superolateral aspects.  The left large Bard 3D Max Mid mesh was also placed accordingly on the left side.  Then, the peritoneal flaps were advanced over the mesh and carried over to close the defects. A running 2-0 V lock suture was used to approximate the edge of the flap onto the peritoneum on the left side first, and then on the right side.  All needles were removed under direct visualization.  Both lipomas were removed through the right 8 mm port.  The 8- mm ports were removed under direct visualization and the Hasson trocar was removed.  The fascial opening was closed using 0 vicryl suture.  Local anesthetic was infused in all incisions as well as bilateral ilioinguinal blocks, and the incisions were closed with 4-0 Monocryl.  The wounds were cleaned and sealed with DermaBond.  Foley catheter was removed and the patient was emerged from anesthesia and extubated and brought to the recovery room for further management.  The patient tolerated the procedure well and all counts were correct at the end of the case.   Melvyn Neth, MD

## 2021-01-07 NOTE — Interval H&P Note (Signed)
History and Physical Interval Note:  01/07/2021 12:52 PM  Caitlyn Baker  has presented today for surgery, with the diagnosis of Right inguinal hernia.  The various methods of treatment have been discussed with the patient and family. After consideration of risks, benefits and other options for treatment, the patient has consented to  Procedure(s): XI ROBOTIC [place] (Right) as a surgical intervention.  The patient's history has been reviewed, patient examined, no change in status, stable for surgery.  I have reviewed the patient's chart and labs.  Questions were answered to the patient's satisfaction.     Tyniesha Howald

## 2021-01-07 NOTE — Transfer of Care (Signed)
Immediate Anesthesia Transfer of Care Note  Patient: Caitlyn Baker  Procedure(s) Performed: XI ROBOTIC ASSISTED INGUINAL HERNIA REPAIR WITH MESH (Bilateral )  Patient Location: PACU  Anesthesia Type:General  Level of Consciousness: drowsy  Airway & Oxygen Therapy: Patient Spontanous Breathing and Patient connected to face mask oxygen  Post-op Assessment: Report given to RN and Post -op Vital signs reviewed and stable  Post vital signs: Reviewed and stable  Last Vitals:  Vitals Value Taken Time  BP 127/62 01/07/21 1619  Temp 36.3 C 01/07/21 1619  Pulse 82 01/07/21 1619  Resp 17 01/07/21 1619  SpO2 100 % 01/07/21 1619    Last Pain:  Vitals:   01/07/21 1028  PainSc: 0-No pain         Complications: No complications documented.

## 2021-01-09 ENCOUNTER — Encounter: Payer: Self-pay | Admitting: Surgery

## 2021-01-22 ENCOUNTER — Ambulatory Visit (INDEPENDENT_AMBULATORY_CARE_PROVIDER_SITE_OTHER): Payer: Medicare Other | Admitting: Surgery

## 2021-01-22 ENCOUNTER — Other Ambulatory Visit: Payer: Self-pay

## 2021-01-22 ENCOUNTER — Encounter: Payer: Self-pay | Admitting: Surgery

## 2021-01-22 VITALS — BP 146/83 | HR 76 | Temp 98.3°F | Ht 64.0 in | Wt 151.0 lb

## 2021-01-22 DIAGNOSIS — Z09 Encounter for follow-up examination after completed treatment for conditions other than malignant neoplasm: Secondary | ICD-10-CM

## 2021-01-22 DIAGNOSIS — K4020 Bilateral inguinal hernia, without obstruction or gangrene, not specified as recurrent: Secondary | ICD-10-CM

## 2021-01-22 NOTE — Patient Instructions (Addendum)
The pain and discomfort will get better with time. You may use a heating pad to the area several times a day for comfort.  Let us know if you discomfort does not get better in the next month to 2 months.   GENERAL POST-OPERATIVE PATIENT INSTRUCTIONS   WOUND CARE INSTRUCTIONS: If the wound becomes bright red and painful or starts to drain infected material that is not clear, please contact your physician immediately.  If the wound is mildly pink and has a thick firm ridge underneath it, this is normal, and is referred to as a healing ridge.  This will resolve over the next 4-6 weeks.  BATHING: You may shower if you have been informed of this by your surgeon. However, Please do not submerge in a tub, hot tub, or pool until incisions are completely sealed or have been told by your surgeon that you may do so.  DIET:  You may eat any foods that you can tolerate.  It is a good idea to eat a high fiber diet and take in plenty of fluids to prevent constipation.  If you do become constipated you may want to take a mild laxative or take ducolax tablets on a daily basis until your bowel habits are regular.  Constipation can be very uncomfortable, along with straining, after recent surgery.  ACTIVITY:  You may want to hug a pillow when coughing and sneezing to add additional support to the surgical area, if you had abdominal or chest surgery, which will decrease pain during these times.  You are encouraged to walk and engage in light activity for the next two weeks.  You should not lift more than 20 pounds for 6 weeks after surgery as it could put you at increased risk for complications.  Twenty pounds is roughly equivalent to a plastic bag of groceries. At that time- Listen to your body when lifting, if you have pain when lifting, stop and then try again in a few days. Soreness after doing exercises or activities of daily living is normal as you get back in to your normal routine.  MEDICATIONS:  Try to take  narcotic medications and anti-inflammatory medications, such as tylenol, ibuprofen, naprosyn, etc., with food.  This will minimize stomach upset from the medication.  Should you develop nausea and vomiting from the pain medication, or develop a rash, please discontinue the medication and contact your physician.  You should not drive, make important decisions, or operate machinery when taking narcotic pain medication.  SUNBLOCK Use sun block to incision area over the next year if this area will be exposed to sun. This helps decrease scarring and will allow you avoid a permanent darkened area over your incision.  QUESTIONS:  Please feel free to call our office if you have any questions, and we will be glad to assist you.

## 2021-01-22 NOTE — Progress Notes (Signed)
01/22/2021  HPI: Caitlyn Baker is a 70 y.o. female s/p robotic assisted bilateral inguinal hernia repair on 01/07/2021.  During surgery she was found to have a left-sided hernia as well so both were repaired.  Today she presents for follow-up.  Reports that she has some soreness in the low abdomen particular the right medial groin but the incisions are doing well.  Vital signs: BP (!) 146/83   Pulse 76   Temp 98.3 F (36.8 C)   Ht 5\' 4"  (1.626 m)   Wt 151 lb (68.5 kg)   SpO2 97%   BMI 25.92 kg/m    Physical Exam: Constitutional: No acute distress Abdomen: Soft, nondistended, with some discomfort to palpation in the right groin towards the external ring and little bit on the medial left groin as well.  No evidence of hernia recurrence at this point.  Assessment/Plan: This is a 70 y.o. female s/p robotic assisted bilateral inguinal hernia repair.  -Discussed with the patient that the discomfort that she is having could be related to scar tissue versus the sutures are placed to hold the mesh.  The sutures are dissolvable and the discomfort should improve with time as the inflammation and scarring improves as well. -Reminded her of no heavy lifting or pushing restrictions for a total of 4 weeks. -Follow-up as needed particularly if the discomfort is not improving over the next month or 2.   Melvyn Neth, [facility]

## 2021-02-27 ENCOUNTER — Other Ambulatory Visit: Payer: Self-pay | Admitting: Family Medicine

## 2021-02-27 DIAGNOSIS — R002 Palpitations: Secondary | ICD-10-CM

## 2021-02-27 NOTE — Telephone Encounter (Signed)
Requested Prescriptions  Pending Prescriptions Disp Refills  . metoprolol succinate (TOPROL-XL) 25 MG 24 hr tablet [Pharmacy Med Name: METOPROLOL SUCC ER 25 MG TAB] 45 tablet 1    Sig: TAKE 1/2 TABLET BY MOUTH EVERY DAY     Cardiovascular:  Beta Blockers Failed - 02/27/2021  1:52 AM      Failed - Last BP in normal range    BP Readings from Last 1 Encounters:  01/22/21 (!) 146/83         Passed - Last Heart Rate in normal range    Pulse Readings from Last 1 Encounters:  01/22/21 76         Passed - Valid encounter within last 6 months    Recent Outpatient Visits          1 month ago Preop examination   [place], [place]   3 months ago Mass of right inguinal region   [place], Kirstie Peri, MD   7 months ago Need for influenza vaccination   [facility] Birdie Sons, MD   1 year ago Frequent unifocal PVCs   [facility] Birdie Sons, MD   1 year ago Palpitations   [facility] Caryn Section, Kirstie Peri, MD

## 2021-03-18 DIAGNOSIS — Z23 Encounter for immunization: Secondary | ICD-10-CM | POA: Diagnosis not present

## 2021-04-14 DIAGNOSIS — I493 Ventricular premature depolarization: Secondary | ICD-10-CM | POA: Diagnosis not present

## 2021-04-14 DIAGNOSIS — E7800 Pure hypercholesterolemia, unspecified: Secondary | ICD-10-CM | POA: Diagnosis not present

## 2021-04-14 DIAGNOSIS — F419 Anxiety disorder, unspecified: Secondary | ICD-10-CM | POA: Diagnosis not present

## 2021-04-14 DIAGNOSIS — R002 Palpitations: Secondary | ICD-10-CM | POA: Diagnosis not present

## 2021-05-06 ENCOUNTER — Other Ambulatory Visit: Payer: Self-pay | Admitting: Family Medicine

## 2021-05-06 DIAGNOSIS — Z1211 Encounter for screening for malignant neoplasm of colon: Secondary | ICD-10-CM

## 2021-08-02 ENCOUNTER — Other Ambulatory Visit: Payer: Self-pay | Admitting: Family Medicine

## 2021-08-02 NOTE — Telephone Encounter (Signed)
Requested medication (s) are due for refill today: yes  Requested medication (s) are on the active medication list: yes  Last refill:  07/16/20 #4 11 RF  Future visit scheduled: yes  Notes to clinic:  overdue lab work   Requested Prescriptions  Pending Prescriptions Disp Refills   alendronate (FOSAMAX) 70 MG tablet [Pharmacy Med Name: ALENDRONATE SODIUM 70 MG TAB] 12 tablet 3    Sig: Take 1 tablet (70 mg total) by mouth every 7 (seven) days. Take with a full glass of water on an empty stomach.     Endocrinology:  Bisphosphonates Failed - 08/02/2021 11:29 AM      Failed - Ca in normal range and within 360 days    Calcium  Date Value Ref Range Status  12/31/2020 10.6 (H) 8.9 - 10.3 mg/dL Final          Failed - Vitamin D in normal range and within 360 days    Vit D, 25-Hydroxy  Date Value Ref Range Status  07/17/2020 33.1 30.0 - 100.0 ng/mL Final    Comment:    Vitamin D deficiency has been defined by the [place] practice guideline as a level of serum 25-OH vitamin D less than 20 ng/mL (1,2). The Endocrine Society went on to further define vitamin D insufficiency as a level between 21 and 29 ng/mL (2). 1. IOM (Institute of Medicine). 2010. Dietary reference    intakes for calcium and D. [place]: The    [employer]. 2. Holick MF, Binkley [place], Bischoff-Ferrari HA, et al.    Evaluation, treatment, and prevention of vitamin D    deficiency: an Endocrine Society clinical practice    guideline. JCEM. 2011 Jul; 96(7):1911-30.           Passed - Valid encounter within last 12 months    Recent Outpatient Visits           7 months ago Preop examination   [facility] Birdie Sons, MD   9 months ago Mass of right inguinal region   [place], Kirstie Peri, MD   1 year ago Need for influenza vaccination   [facility] Birdie Sons, MD   1 year ago Frequent unifocal  PVCs   [facility] Birdie Sons, MD   1 year ago Palpitations   [facility] Birdie Sons, MD

## 2021-08-11 ENCOUNTER — Ambulatory Visit (INDEPENDENT_AMBULATORY_CARE_PROVIDER_SITE_OTHER): Payer: Medicare Other | Admitting: Family Medicine

## 2021-08-11 ENCOUNTER — Other Ambulatory Visit: Payer: Self-pay

## 2021-08-11 ENCOUNTER — Encounter: Payer: Self-pay | Admitting: Family Medicine

## 2021-08-11 VITALS — BP 126/71 | HR 75 | Temp 98.2°F | Resp 18 | Ht 64.0 in | Wt 153.0 lb

## 2021-08-11 DIAGNOSIS — E559 Vitamin D deficiency, unspecified: Secondary | ICD-10-CM

## 2021-08-11 DIAGNOSIS — R5383 Other fatigue: Secondary | ICD-10-CM

## 2021-08-11 DIAGNOSIS — Z23 Encounter for immunization: Secondary | ICD-10-CM

## 2021-08-11 DIAGNOSIS — Z0000 Encounter for general adult medical examination without abnormal findings: Secondary | ICD-10-CM | POA: Diagnosis not present

## 2021-08-11 DIAGNOSIS — E7800 Pure hypercholesterolemia, unspecified: Secondary | ICD-10-CM | POA: Diagnosis not present

## 2021-08-11 DIAGNOSIS — Z289 Immunization not carried out for unspecified reason: Secondary | ICD-10-CM

## 2021-08-11 DIAGNOSIS — R609 Edema, unspecified: Secondary | ICD-10-CM | POA: Diagnosis not present

## 2021-08-11 DIAGNOSIS — Z1211 Encounter for screening for malignant neoplasm of colon: Secondary | ICD-10-CM

## 2021-08-11 DIAGNOSIS — M810 Age-related osteoporosis without current pathological fracture: Secondary | ICD-10-CM | POA: Diagnosis not present

## 2021-08-11 MED ORDER — SHINGRIX 50 MCG/0.5ML IM SUSR: 0.5000 mL | Freq: Once | INTRAMUSCULAR | 0 refills | Status: AC

## 2021-08-11 NOTE — Progress Notes (Signed)
Annual Wellness Visit     Patient: Caitlyn Baker, Female    DOB: 05-03-1951, 70 y.o.   MRN: [id number] Visit Date: 08/11/2021  Today's Provider: Lelon Huh, MD   Chief Complaint  Patient presents with   Medicare Wellness   Hyperlipidemia   Hyperglycemia   Subjective    Caitlyn Baker is a 70 y.o. female who presents today for her Annual Wellness Visit. She reports consuming a general diet. Home exercise routine includes walking daily. She generally feels fairly well. She reports sleeping fairly well. She does not have additional problems to discuss today.   HPI Lipid/Cholesterol, Follow-up  Last lipid panel Other pertinent labs  Lab Results  Component Value Date   CHOL 235 (H) 07/17/2020   HDL 58 07/17/2020   LDLCALC 143 (H) 07/17/2020   TRIG 188 (H) 07/17/2020   CHOLHDL 4.1 07/17/2020   Lab Results  Component Value Date   ALT 14 06/28/2019   AST 15 06/28/2019   PLT 304 12/31/2020   TSH 1.440 09/18/2019     She was last seen for this 1  year  ago.  Management since that visit includes continuing same medications. Patient was advised to avoid saturated fats (red meats and pork) as much as possible.  She reports good compliance with treatment. She is not having side effects.   Symptoms: No chest pain No chest pressure/discomfort  No dyspnea Yes lower extremity edema (ankle swelling)  No numbness or tingling of extremity No orthopnea  No palpitations No paroxysmal nocturnal dyspnea  No speech difficulty No syncope   Current diet: well balanced Current exercise: walking  The 10-year ASCVD risk score (Arnett DK, et al., 2019) is: 10.6%  ---------------------------------------------------------------------------------------------------   Vitamin D deficiency, follow-up  Lab Results  Component Value Date   VD25OH 33.1 07/17/2020   VD25OH 27.7 (L) 06/28/2019   VD25OH 32.6 04/12/2018   CALCIUM 10.6 (H) 12/31/2020   CALCIUM 10.5 (H) 07/17/2020  ) Wt  Readings from Last 3 Encounters:  08/11/21 153 lb (69.4 kg)  01/22/21 151 lb (68.5 kg)  01/01/21 155 lb (70.3 kg)    She was last seen for vitamin D deficiency 1  year  ago.  Management since that visit includes continuing Vitamin D supplement. She reports good compliance with treatment. She is not having side effects.   Symptoms: No change in energy level No numbness or tingling  No bone pain No unexplained fracture   ---------------------------------------------------------------------------------------------------   Elevated blood glucose, Follow-up  Lab Results  Component Value Date   HGBA1C 6.0 (H) 07/17/2020   HGBA1C 5.8 10/15/2014   GLUCOSE 102 (H) 12/31/2020   GLUCOSE 85 07/17/2020   GLUCOSE 116 (H) 09/18/2019    Last seen for for this1  year  ago.  Management since that visit includes continuing lifestyle modifications. Current symptoms include none and have been stable.  Prior visit with dietician: no Current diet: well balanced Current exercise: walking  -----------------------------------------------------------------------------------------     Medications: Outpatient Medications Prior to Visit  Medication Sig   alendronate (FOSAMAX) 70 MG tablet TAKE 1 TABLET (70 MG TOTAL) BY MOUTH EVERY 7 (SEVEN) DAYS. TAKE WITH A FULL GLASS OF WATER ON AN EMPTY STOMACH.   aspirin 81 MG tablet Take 81 mg by mouth daily.   Calcium Carbonate-Vitamin D 600-400 MG-UNIT tablet Take 1 tablet by mouth daily.   cholecalciferol (VITAMIN D3) 25 MCG (1000 UNIT) tablet Take 1,000 Units by mouth daily.   Coenzyme Q10 (COQ10  PO) Take 1 capsule by mouth daily.   lovastatin (MEVACOR) 20 MG tablet TAKE 1 TABLET BY MOUTH EVERY DAY (Patient taking differently: Take 20 mg by mouth at bedtime.)   Magnesium 250 MG TABS Take 250 mg by mouth daily.   metoprolol succinate (TOPROL-XL) 25 MG 24 hr tablet TAKE 1/2 TABLET BY MOUTH EVERY DAY   MULTIPLE VITAMIN PO Take 1 tablet by mouth daily.    [DISCONTINUED] ibuprofen (ADVIL) 600 MG tablet Take 1 tablet (600 mg total) by mouth every 8 (eight) hours as needed for mild pain or moderate pain.   No facility-administered medications prior to visit.    Allergies  Allergen Reactions   Codeine Nausea Only and Nausea And Vomiting   Prednisone Other (See Comments)    Other reaction(s): Chest pain, Suicidal Severe anxiety and depression.    Patient Care Team: Birdie Sons, MD as PCP - General (Family Medicine) Ubaldo Glassing Javier Docker, MD as Consulting Physician (Cardiology) Pa, [facility])  Review of Systems  Constitutional:  Negative for chills, fatigue and fever.  HENT:  Negative for congestion, ear pain, rhinorrhea, sneezing and sore throat.   Eyes: Negative.  Negative for pain and redness.  Respiratory:  Negative for cough, shortness of breath and wheezing.   Cardiovascular:  Negative for chest pain and leg swelling.  Gastrointestinal:  Negative for abdominal pain, blood in stool, constipation, diarrhea and nausea.  Endocrine: Negative for polydipsia and polyphagia.  Genitourinary: Negative.  Negative for dysuria, flank pain, hematuria, pelvic pain, vaginal bleeding and vaginal discharge.  Musculoskeletal:  Positive for joint swelling (ankle swelling) and myalgias. Negative for arthralgias, back pain and gait problem.  Skin:  Negative for rash.  Neurological: Negative.  Negative for dizziness, tremors, seizures, weakness, light-headedness, numbness and headaches.  Hematological:  Negative for adenopathy.  Psychiatric/Behavioral: Negative.  Negative for behavioral problems, confusion and dysphoric mood. The patient is not nervous/anxious and is not hyperactive.        Objective    Vitals: BP 126/71 (BP Location: Left Arm, Patient Position: Sitting, Cuff Size: Normal)   Pulse 75   Temp 98.2 F (36.8 C) (Oral)   Resp 18   Ht 5\' 4"  (1.626 m)   Wt 153 lb (69.4 kg)   SpO2 100% Comment: room air  BMI 26.26 kg/m     Physical Exam  General Appearance:     Well developed, well nourished female. Alert, cooperative, in no acute distress, appears stated age   Head:    Normocephalic, without obvious abnormality, atraumatic  Eyes:    PERRL, conjunctiva/corneas clear, EOM's intact, fundi    benign, both eyes  Ears:    Normal TM's and external ear canals, both ears  Neck:   Supple, symmetrical, trachea midline, no adenopathy;    thyroid:  no enlargement/tenderness/nodules; no carotid   bruit or JVD  Back:     Symmetric, no curvature, ROM normal, no CVA tenderness  Lungs:     Clear to auscultation bilaterally, respirations unlabored  Chest Wall:    No tenderness or deformity   Heart:    Normal heart rate. Normal rhythm. No murmurs, rubs, or gallops.    Breast Exam:    normal appearance, no masses or tenderness, Inspection negative  Abdomen:     Soft, non-tender, bowel sounds active all four quadrants,    no masses, no organomegaly  Pelvic:    deferred  Extremities:   All extremities are intact. No cyanosis. 1+ ankle edema.  Pulses:   2+ and symmetric all extremities  Skin:   Skin color, texture, turgor normal, no rashes or lesions  Lymph nodes:   Cervical, supraclavicular, and axillary nodes normal  Neurologic:   CNII-XII intact, normal strength, sensation and reflexes    throughout     Most recent functional status assessment: In your present state of health, do you have any difficulty performing the following activities: 08/11/2021  Hearing? N  Vision? N  Difficulty concentrating or making decisions? N  Walking or climbing stairs? N  Dressing or bathing? N  Doing errands, shopping? N  Some recent data might be hidden   Most recent fall risk assessment: Fall Risk  08/11/2021  Falls in the past year? 1  Comment -  Number falls in past yr: 1  Injury with Fall? 0  Risk for fall due to : History of fall(s)  Follow up Falls evaluation completed    Most recent depression screenings: PHQ  2/9 Scores 08/11/2021 01/01/2021  PHQ - 2 Score 0 0  PHQ- 9 Score 1 1   Most recent cognitive screening: 6CIT Screen 04/17/2020  What Year? 0 points  What month? 0 points  What time? 0 points  Count back from 20 0 points  Months in reverse 0 points  Repeat phrase 0 points  Total Score 0   Most recent Audit-C alcohol use screening Alcohol Use Disorder Test (AUDIT) 08/11/2021  1. How often do you have a drink containing alcohol? 0  2. How many drinks containing alcohol do you have on a typical day when you are drinking? 0  3. How often do you have six or more drinks on one occasion? 0  AUDIT-C Score 0  Alcohol Brief Interventions/Follow-up -   A score of 3 or more in women, and 4 or more in men indicates increased risk for alcohol abuse, EXCEPT if all of the points are from question 1   No results found for any visits on 08/11/21.  Assessment & Plan     Annual wellness visit done today including the all of the following: Reviewed patient's Family Medical History Reviewed and updated list of patient's medical providers Assessment of cognitive impairment was done Assessed patient's functional ability Established a written schedule for health screening [place] Completed and Reviewed  Exercise Activities and Dietary recommendations  Goals      Exercise 3x per week (30 min per time)     Recommend to start exercising 3 days a week for at least 30 minutes at a time.     Prevent falls     Recommend to remove any items from the home that may cause slips or trips.        Immunization History  Administered Date(s) Administered   Fluad Quad(high Dose 65+) 06/28/2019, 07/16/2020, 08/11/2021   Influenza Split 07/19/2010   Influenza,inj,Quad PF,6+ Mos 07/31/2013   Influenza-Unspecified 09/04/2015, 08/16/2017, 09/01/2018   PFIZER Comirnaty(Gray Top)Covid-19 Tri-Sucrose Vaccine 03/18/2021   PFIZER(Purple Top)SARS-COV-2 Vaccination 12/29/2019, 01/23/2020    Pneumococcal Conjugate-13 11/22/2015   Pneumococcal Polysaccharide-23 03/03/2017   Td 08/06/2004, 10/12/2014   Tdap 10/12/2014    Health Maintenance  Topic Date Due   Zoster Vaccines- Shingrix (1 of 2) Never done   Fecal DNA (Cologuard)  05/09/2021   DEXA SCAN  05/24/2021   COVID-19 Vaccine (4 - Booster for Pfizer series) 07/19/2021   MAMMOGRAM  09/20/2022   TETANUS/TDAP  10/12/2024   INFLUENZA VACCINE  Completed   Hepatitis C Screening  Completed   HPV VACCINES  Aged Out     Discussed health benefits of physical activity, and encouraged her to engage in regular exercise appropriate for her age and condition.    1. Osteoporosis, unspecified osteoporosis type, unspecified pathological fracture presence Doing well on alendronate.  - DG Bone Density [place]; Future  2. Hypercholesteremia She is tolerating lovastatin well with no adverse effects.   - CBC - Comprehensive metabolic panel - Lipid panel  3. Vitamin D deficiency  - VITAMIN D 25 Hydroxy (Vit-D Deficiency, Fractures)  4. Other fatigue Long standing, about at baseline.   5. Edema, unspecified type Dependent. Likely exacerbated by beta-blocker  - TSH  6. Colon cancer screening  - Cologuard  7. Prescription for Shingrix. Vaccine not administered in office.   - Zoster Vaccine Adjuvanted [facility]) injection; Inject 0.5 mLs into the muscle once for 1 dose.  Dispense: 0.5 mL; Refill: 0  8. Need for influenza vaccination  - Flu Vaccine QUAD High Dose IM (Fluad)    The entirety of the information documented in the History of Present Illness, Review of Systems and Physical Exam were personally obtained by me. Portions of this information were initially documented by the CMA and reviewed by me for thoroughness and accuracy.     Lelon Huh, MD  [facility] [phone] (phone) [phone] (fax)  [place]

## 2021-08-11 NOTE — Patient Instructions (Addendum)
Please review the attached list of medications and notify my office if there are any errors.   The CDC recommends two doses of Shingrix (the shingles vaccine) separated by 2 to 6 months for adults age 71 years and older. I recommend checking with your pharmacy  plan regarding coverage for this vaccine.    I strongly recommend a Covid Omicron booster if it's been more than 2 months since your last covid booster or infection.

## 2021-08-12 LAB — LIPID PANEL
Chol/HDL Ratio: 3.7 ratio (ref 0.0–4.4)
Cholesterol, Total: 209 mg/dL — ABNORMAL HIGH (ref 100–199)
HDL: 57 mg/dL (ref >39)
LDL Chol Calc (NIH): 128 mg/dL — ABNORMAL HIGH (ref 0–99)
Triglycerides: 138 mg/dL (ref 0–149)
VLDL Cholesterol Cal: 24 mg/dL (ref 5–40)

## 2021-08-12 LAB — CBC
Hematocrit: 37.6 % (ref 34.0–46.6)
Hemoglobin: 12.8 g/dL (ref 11.1–15.9)
MCH: 31.2 pg (ref 26.6–33.0)
MCHC: 34 g/dL (ref 31.5–35.7)
MCV: 92 fL (ref 79–97)
Platelets: 280 10*3/uL (ref 150–450)
RBC: 4.1 x10E6/uL (ref 3.77–5.28)
RDW: 11.5 % — ABNORMAL LOW (ref 11.7–15.4)
WBC: 5.5 10*3/uL (ref 3.4–10.8)

## 2021-08-12 LAB — COMPREHENSIVE METABOLIC PANEL
ALT: 12 IU/L (ref 0–32)
AST: 15 IU/L (ref 0–40)
Albumin/Globulin Ratio: 1.8 (ref 1.2–2.2)
Albumin: 4.2 g/dL (ref 3.7–4.7)
Alkaline Phosphatase: 69 IU/L (ref 44–121)
BUN/Creatinine Ratio: 25 (ref 12–28)
BUN: 16 mg/dL (ref 8–27)
Bilirubin Total: 0.5 mg/dL (ref 0.0–1.2)
CO2: 23 mmol/L (ref 20–29)
Calcium: 10.4 mg/dL — ABNORMAL HIGH (ref 8.7–10.3)
Chloride: 104 mmol/L (ref 96–106)
Creatinine, Ser: 0.65 mg/dL (ref 0.57–1.00)
Globulin, Total: 2.3 g/dL (ref 1.5–4.5)
Glucose: 101 mg/dL — ABNORMAL HIGH (ref 70–99)
Potassium: 4.5 mmol/L (ref 3.5–5.2)
Sodium: 141 mmol/L (ref 134–144)
Total Protein: 6.5 g/dL (ref 6.0–8.5)
eGFR: 94 mL/min/{1.73_m2} (ref >59)

## 2021-08-12 LAB — TSH: TSH: 1.54 u[IU]/mL (ref 0.450–4.500)

## 2021-08-12 LAB — VITAMIN D 25 HYDROXY (VIT D DEFICIENCY, FRACTURES): Vit D, 25-Hydroxy: 33.4 ng/mL (ref 30.0–100.0)

## 2021-08-15 ENCOUNTER — Other Ambulatory Visit: Payer: Self-pay | Admitting: Family Medicine

## 2021-08-15 DIAGNOSIS — Z1231 Encounter for screening mammogram for malignant neoplasm of breast: Secondary | ICD-10-CM

## 2021-08-27 ENCOUNTER — Other Ambulatory Visit: Payer: Self-pay | Admitting: Family Medicine

## 2021-08-27 DIAGNOSIS — E7800 Pure hypercholesterolemia, unspecified: Secondary | ICD-10-CM

## 2021-08-27 DIAGNOSIS — R002 Palpitations: Secondary | ICD-10-CM

## 2021-08-27 NOTE — Telephone Encounter (Signed)
Requested Prescriptions  Pending Prescriptions Disp Refills  . lovastatin (MEVACOR) 20 MG tablet [Pharmacy Med Name: LOVASTATIN 20 MG TABLET] 90 tablet 2    Sig: TAKE 1 TABLET BY MOUTH EVERY DAY     Cardiovascular:  Antilipid - Statins Failed - 08/27/2021  1:42 AM      Failed - Total Cholesterol in normal range and within 360 days    Cholesterol, Total  Date Value Ref Range Status  08/11/2021 209 (H) 100 - 199 mg/dL Final         Failed - LDL in normal range and within 360 days    LDL Chol Calc (NIH)  Date Value Ref Range Status  08/11/2021 128 (H) 0 - 99 mg/dL Final         Passed - HDL in normal range and within 360 days    HDL  Date Value Ref Range Status  08/11/2021 57 >39 mg/dL Final         Passed - Triglycerides in normal range and within 360 days    Triglycerides  Date Value Ref Range Status  08/11/2021 138 0 - 149 mg/dL Final         Passed - Patient is not pregnant      Passed - Valid encounter within last 12 months    Recent Outpatient Visits          2 weeks ago Need for influenza vaccination   [facility] Birdie Sons, MD   7 months ago Preop examination   [facility] Birdie Sons, MD   9 months ago Mass of right inguinal region   [place], Kirstie Peri, MD   1 year ago Need for influenza vaccination   [facility] Birdie Sons, MD   1 year ago Frequent unifocal PVCs   [facility] Birdie Sons, MD             . metoprolol succinate (TOPROL-XL) 25 MG 24 hr tablet [Pharmacy Med Name: METOPROLOL SUCC ER 25 MG TAB] 45 tablet 1    Sig: TAKE 1/2 TABLET BY MOUTH EVERY DAY     Cardiovascular:  Beta Blockers Passed - 08/27/2021  1:42 AM      Passed - Last BP in normal range    BP Readings from Last 1 Encounters:  08/11/21 126/71         Passed - Last Heart Rate in normal range    Pulse Readings from Last 1 Encounters:  08/11/21 75         Passed -  Valid encounter within last 6 months    Recent Outpatient Visits          2 weeks ago Need for influenza vaccination   [facility] Birdie Sons, MD   7 months ago Preop examination   [facility] Birdie Sons, MD   9 months ago Mass of right inguinal region   [place], Kirstie Peri, MD   1 year ago Need for influenza vaccination   [facility] Birdie Sons, MD   1 year ago Frequent unifocal PVCs   [facility] Birdie Sons, MD

## 2021-09-03 ENCOUNTER — Other Ambulatory Visit: Payer: Self-pay

## 2021-09-03 ENCOUNTER — Ambulatory Visit
Admission: RE | Admit: 2021-09-03 | Discharge: 2021-09-03 | Disposition: A | Discharge status: Home / Self Care | Payer: Medicare Other | Source: Ambulatory Visit | Attending: Family Medicine | Admitting: Family Medicine

## 2021-09-03 DIAGNOSIS — M810 Age-related osteoporosis without current pathological fracture: Secondary | ICD-10-CM | POA: Insufficient documentation

## 2021-09-03 DIAGNOSIS — M8589 Other specified disorders of bone density and structure, multiple sites: Secondary | ICD-10-CM | POA: Diagnosis not present

## 2021-09-03 DIAGNOSIS — Z1211 Encounter for screening for malignant neoplasm of colon: Secondary | ICD-10-CM | POA: Diagnosis not present

## 2021-09-11 ENCOUNTER — Other Ambulatory Visit: Payer: Self-pay | Admitting: Family Medicine

## 2021-09-11 DIAGNOSIS — Z1211 Encounter for screening for malignant neoplasm of colon: Secondary | ICD-10-CM

## 2021-09-11 DIAGNOSIS — R195 Other fecal abnormalities: Secondary | ICD-10-CM

## 2021-09-11 LAB — COLOGUARD: COLOGUARD: POSITIVE — AB

## 2021-09-17 ENCOUNTER — Other Ambulatory Visit: Payer: Self-pay

## 2021-09-17 ENCOUNTER — Telehealth: Payer: Self-pay

## 2021-09-17 DIAGNOSIS — Z1211 Encounter for screening for malignant neoplasm of colon: Secondary | ICD-10-CM

## 2021-09-17 DIAGNOSIS — R195 Other fecal abnormalities: Secondary | ICD-10-CM

## 2021-09-17 MED ORDER — NA SULFATE-K SULFATE-MG SULF 17.5-3.13-1.6 GM/177ML PO SOLN: 1.0000 | Freq: Once | ORAL | 0 refills | Status: AC

## 2021-09-17 NOTE — Progress Notes (Signed)
Gastroenterology Pre-Procedure Review  Request Date: 10/15/21 Requesting Physician: Dr. Marius Ditch  PATIENT REVIEW QUESTIONS: The patient responded to the following health history questions as indicated:    1. Are you having any GI issues? no 2. Do you have a personal history of Polyps? no 3. Do you have a family history of Colon Cancer or Polyps? no 4. Diabetes Mellitus? no 5. Joint replacements in the past 12 months?no 6. Major health problems in the past 3 months?no 7. Any artificial heart valves, MVP, or defibrillator?no    MEDICATIONS & ALLERGIES:    Patient reports the following regarding taking any anticoagulation/antiplatelet therapy:   Plavix, Coumadin, Eliquis, Xarelto, Lovenox, Pradaxa, Brilinta, or Effient? no Aspirin? yes (81 mg)  Patient confirms/reports the following medications:  Current Outpatient Medications  Medication Sig Dispense Refill   alendronate (FOSAMAX) 70 MG tablet TAKE 1 TABLET (70 MG TOTAL) BY MOUTH EVERY 7 (SEVEN) DAYS. TAKE WITH A FULL GLASS OF WATER ON AN EMPTY STOMACH. 12 tablet 3   aspirin 81 MG tablet Take 81 mg by mouth daily.     Calcium Carbonate-Vitamin D 600-400 MG-UNIT tablet Take 1 tablet by mouth daily.     cholecalciferol (VITAMIN D3) 25 MCG (1000 UNIT) tablet Take 1,000 Units by mouth daily.     Coenzyme Q10 (COQ10 PO) Take 1 capsule by mouth daily.     lovastatin (MEVACOR) 20 MG tablet TAKE 1 TABLET BY MOUTH EVERY DAY 90 tablet 2   Magnesium 250 MG TABS Take 250 mg by mouth daily.     metoprolol succinate (TOPROL-XL) 25 MG 24 hr tablet TAKE 1/2 TABLET BY MOUTH EVERY DAY 45 tablet 1   MULTIPLE VITAMIN PO Take 1 tablet by mouth daily.     No current facility-administered medications for this visit.    Patient confirms/reports the following allergies:  Allergies  Allergen Reactions   Codeine Nausea Only and Nausea And Vomiting   Prednisone Other (See Comments)    Other reaction(s): Chest pain, Suicidal Severe anxiety and depression.     No orders of the defined types were placed in this encounter.   AUTHORIZATION INFORMATION Primary Insurance: 1D#: Group #:  Secondary Insurance: 1D#: Group #:  SCHEDULE INFORMATION: Date: 10/15/21 Time: Location: [facility]

## 2021-09-17 NOTE — Telephone Encounter (Signed)
Patient is ready to schedule procedure. Clinical staff will follow up with patient. °

## 2021-09-17 NOTE — Telephone Encounter (Signed)
Procedure scheduled for 10/15/21.

## 2021-09-22 ENCOUNTER — Ambulatory Visit
Admission: RE | Admit: 2021-09-22 | Discharge: 2021-09-22 | Disposition: A | Discharge status: Home / Self Care | Payer: Medicare Other | Source: Ambulatory Visit | Attending: Family Medicine | Admitting: Family Medicine

## 2021-09-22 ENCOUNTER — Other Ambulatory Visit: Payer: Self-pay

## 2021-09-22 DIAGNOSIS — Z1231 Encounter for screening mammogram for malignant neoplasm of breast: Secondary | ICD-10-CM | POA: Insufficient documentation

## 2021-10-15 ENCOUNTER — Ambulatory Visit: Payer: Medicare Other | Admitting: Anesthesiology

## 2021-10-15 ENCOUNTER — Ambulatory Visit
Admission: RE | Admit: 2021-10-15 | Discharge: 2021-10-15 | Disposition: A | Discharge status: Home / Self Care | Payer: Medicare Other | Attending: Gastroenterology | Admitting: Gastroenterology

## 2021-10-15 ENCOUNTER — Encounter
Admission: RE | Disposition: A | Discharge status: Home / Self Care | Payer: Self-pay | Source: Home / Self Care | Attending: Gastroenterology

## 2021-10-15 DIAGNOSIS — Z86010 Personal history of colonic polyps: Secondary | ICD-10-CM

## 2021-10-15 DIAGNOSIS — K5730 Diverticulosis of large intestine without perforation or abscess without bleeding: Secondary | ICD-10-CM | POA: Diagnosis not present

## 2021-10-15 DIAGNOSIS — R195 Other fecal abnormalities: Secondary | ICD-10-CM | POA: Diagnosis not present

## 2021-10-15 DIAGNOSIS — D127 Benign neoplasm of rectosigmoid junction: Secondary | ICD-10-CM | POA: Insufficient documentation

## 2021-10-15 DIAGNOSIS — K635 Polyp of colon: Secondary | ICD-10-CM | POA: Diagnosis not present

## 2021-10-15 DIAGNOSIS — M810 Age-related osteoporosis without current pathological fracture: Secondary | ICD-10-CM | POA: Insufficient documentation

## 2021-10-15 DIAGNOSIS — E785 Hyperlipidemia, unspecified: Secondary | ICD-10-CM | POA: Diagnosis not present

## 2021-10-15 DIAGNOSIS — D120 Benign neoplasm of cecum: Secondary | ICD-10-CM | POA: Diagnosis not present

## 2021-10-15 DIAGNOSIS — Z860101 Personal history of adenomatous and serrated colon polyps: Secondary | ICD-10-CM

## 2021-10-15 DIAGNOSIS — Z1211 Encounter for screening for malignant neoplasm of colon: Secondary | ICD-10-CM | POA: Diagnosis not present

## 2021-10-15 HISTORY — PX: COLONOSCOPY WITH PROPOFOL: SHX5780

## 2021-10-15 SURGERY — COLONOSCOPY WITH PROPOFOL
Anesthesia: General

## 2021-10-15 MED ORDER — PROPOFOL 10 MG/ML IV BOLUS
INTRAVENOUS | Status: DC | PRN
  Administered 2021-10-15: 60 mg via INTRAVENOUS
  Administered 2021-10-15: 40 mg via INTRAVENOUS

## 2021-10-15 MED ORDER — PROPOFOL 500 MG/50ML IV EMUL
INTRAVENOUS | Status: AC
  Filled 2021-10-15: qty 50

## 2021-10-15 MED ORDER — LIDOCAINE HCL (CARDIAC) PF 100 MG/5ML IV SOSY
PREFILLED_SYRINGE | INTRAVENOUS | Status: DC | PRN
Start: 2021-10-15 — End: 2021-10-15
  Administered 2021-10-15: 50 mg via INTRAVENOUS

## 2021-10-15 MED ORDER — LIDOCAINE HCL (PF) 2 % IJ SOLN
INTRAMUSCULAR | Status: AC
  Filled 2021-10-15: qty 5

## 2021-10-15 MED ORDER — ONDANSETRON HCL 4 MG/2ML IJ SOLN
INTRAMUSCULAR | Status: DC | PRN
  Administered 2021-10-15: 4 mg via INTRAVENOUS

## 2021-10-15 MED ORDER — PROPOFOL 500 MG/50ML IV EMUL
INTRAVENOUS | Status: DC | PRN
  Administered 2021-10-15: 150 ug/kg/min via INTRAVENOUS

## 2021-10-15 MED ORDER — SODIUM CHLORIDE 0.9 % IV SOLN
INTRAVENOUS | Status: DC
  Administered 2021-10-15: 07:00:00 20 mL/h via INTRAVENOUS

## 2021-10-15 MED ORDER — PHENYLEPHRINE HCL-NACL 20-0.9 MG/250ML-% IV SOLN
INTRAVENOUS | Status: AC
  Filled 2021-10-15: qty 250

## 2021-10-15 NOTE — H&P (Signed)
Cephas Darby, MD [address]  [place]  [place], [place] [address]  Main: [phone]  Fax: [phone] Pager: [phone]  Primary Care Physician:  Birdie Sons, MD Primary Gastroenterologist:  Dr. Cephas Darby  Pre-Procedure History & Physical: HPI:  Caitlyn Baker is a 70 y.o. female is here for an colonoscopy.   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 Authorizing Provider  aspirin 81 MG tablet Take 81 mg by mouth daily.   Yes [provider]  Calcium Carbonate-Vitamin D 600-400 MG-UNIT tablet Take 1 tablet by mouth daily.   Yes [provider]  cholecalciferol (VITAMIN D3) 25 MCG (1000 UNIT) tablet Take 1,000 Units by mouth daily.   Yes [provider]  Coenzyme Q10 (COQ10 PO) Take 1 capsule by mouth daily.   Yes [provider]  lovastatin (MEVACOR) 20 MG tablet TAKE 1 TABLET BY MOUTH EVERY DAY 08/27/21  Yes Birdie Sons, MD  Magnesium 250 MG TABS Take 250 mg by mouth daily.   Yes [provider]  metoprolol succinate (TOPROL-XL) 25 MG 24 hr tablet TAKE 1/2 TABLET BY MOUTH EVERY DAY 08/27/21  Yes Birdie Sons, MD  MULTIPLE VITAMIN PO Take 1 tablet by mouth daily.   Yes [provider]  alendronate (FOSAMAX) 70 MG tablet TAKE 1 TABLET (70 MG TOTAL) BY MOUTH EVERY 7 (SEVEN) DAYS. TAKE WITH  A FULL GLASS OF WATER ON AN EMPTY STOMACH. 08/03/21   Birdie Sons, MD    Allergies as of 09/17/2021 - Review Complete 08/11/2021  Allergen Reaction Noted   Codeine Nausea Only and Nausea And Vomiting 11/13/2014   Prednisone Other (See Comments) 11/21/2015    Family History  Problem Relation Age of Onset   Hypertension Mother    Breast cancer Neg Hx     Social History   Socioeconomic History   Marital status: Married    Spouse name: Richardson Landry   Number of children: 3   Years of education: Not on file   Highest education level: Some college, no degree  Occupational History   Occupation: retired  Tobacco Use   Smoking status: Never   Smokeless tobacco: Never  Vaping Use   Vaping Use: Never used  Substance and Sexual Activity   Alcohol use: No   Drug use: No   Sexual activity: Not on file  Other Topics Concern   Not on file  Social History Narrative   Not on file   Social Determinants of Health   Financial Resource Strain: Not on file  Food Insecurity: Not on file  Transportation Needs: Not on file  Physical Activity: Not on file  Stress: Not on file  Social Connections: Not on file  Intimate Partner Violence: Not on file    Review of Systems: See HPI,  otherwise negative ROS  Physical Exam: BP (!) 158/76    Pulse 87    Temp (!) 96.9 F (36.1 C) (Temporal)    Resp 20    Ht 5\' 4"  (1.626 m)    Wt 68.9 kg    SpO2 100%    BMI 26.09 kg/m  General:   Alert,  pleasant and cooperative in NAD Head:  Normocephalic and atraumatic. Neck:  Supple; no masses or thyromegaly. Lungs:  Clear throughout to auscultation.    Heart:  Regular rate and rhythm. Abdomen:  Soft, nontender and nondistended. Normal bowel sounds, without guarding, and without rebound.   Neurologic:  Alert and  oriented x4;  grossly normal neurologically.  Impression/Plan: Caitlyn Baker is here for an colonoscopy to be performed for cologuard positive  Risks, benefits, limitations, and alternatives  regarding  colonoscopy have been reviewed with the patient.  Questions have been answered.  All parties agreeable.   Sherri Sear, MD  10/15/2021, 7:53 AM

## 2021-10-15 NOTE — Anesthesia Postprocedure Evaluation (Signed)
Anesthesia Post Note  Patient: Caitlyn Baker  Procedure(s) Performed: COLONOSCOPY WITH PROPOFOL  Patient location during evaluation: Endoscopy Anesthesia Type: General Level of consciousness: awake and alert Pain management: pain level controlled Vital Signs Assessment: post-procedure vital signs reviewed and stable Respiratory status: spontaneous breathing, nonlabored ventilation, respiratory function stable and patient connected to nasal cannula oxygen Cardiovascular status: blood pressure returned to baseline and stable Postop Assessment: no apparent nausea or vomiting Anesthetic complications: no   No notable events documented.   Last Vitals:  Vitals:   10/15/21 0824 10/15/21 0833  BP: (!) 96/46 131/72  Pulse: 74   Resp: 15   Temp:    SpO2: 99%     Last Pain:  Vitals:   10/15/21 0853  TempSrc:   PainSc: 0-No pain                 Arita Miss

## 2021-10-15 NOTE — Anesthesia Procedure Notes (Signed)
Date/Time: 10/15/2021 7:58 AM Performed by: Johnna Acosta, CRNA Pre-anesthesia Checklist: Patient identified, Emergency Drugs available, Suction available, Patient being monitored and Timeout performed Patient Re-evaluated:Patient Re-evaluated prior to induction Oxygen Delivery Method: Nasal cannula Preoxygenation: Pre-oxygenation with 100% oxygen Induction Type: IV induction

## 2021-10-15 NOTE — Transfer of Care (Addendum)
Immediate Anesthesia Transfer of Care Note  Patient: Caitlyn Baker  Procedure(s) Performed: COLONOSCOPY WITH PROPOFOL  Patient Location: PACU  Anesthesia Type:General  Level of Consciousness: sedated  Airway & Oxygen Therapy: Patient Spontanous Breathing  Post-op Assessment: Report given to RN and Post -op Vital signs reviewed and stable  Post vital signs: Reviewed and stable  Last Vitals:  Vitals Value Taken Time  BP 96/46 10/15/21 0824  Temp    Pulse 72 10/15/21 0824  Resp 15 10/15/21 0824  SpO2 99 % 10/15/21 0824  Vitals shown include unvalidated device data.  Last Pain:  Vitals:   10/15/21 0650  TempSrc: Temporal  PainSc: 0-No pain         Complications: No notable events documented.

## 2021-10-15 NOTE — Op Note (Signed)
[facility] Patient Name: Caitlyn Baker Procedure Date: 10/15/2021 7:14 AM MRN: [id number] Account #: [id number] Date of Birth: 14-Jun-1951 Admit Type: Outpatient Age: 70 Room: [facility] ENDO ROOM 4 Gender: Female Note Status: Finalized Instrument Name: Colonoscope [id number] Procedure:             Colonoscopy Indications:           Last colonoscopy: February 2005, Positive Cologuard                         test Providers:             Lin Landsman MD, MD Referring MD:          Kirstie Peri. Caryn Section, MD (Referring MD) Medicines:             General Anesthesia Complications:         No immediate complications. Estimated blood loss: None. Procedure:             Pre-Anesthesia Assessment:                        - Prior to the procedure, a History and Physical was                         performed, and patient medications and allergies were                         reviewed. The patient is competent. The risks and                         benefits of the procedure and the sedation options and                         risks were discussed with the patient. All questions                         were answered and informed consent was obtained.                         Patient identification and proposed procedure were                         verified by the physician, the nurse, the                         anesthesiologist, the anesthetist and the technician                         in the pre-procedure area in the procedure room in the                         endoscopy suite. Mental Status Examination: alert and                         oriented. Airway Examination: normal oropharyngeal                         airway and neck mobility. Respiratory Examination:  clear to auscultation. CV Examination: normal.                         Prophylactic Antibiotics: The patient does not require                         prophylactic antibiotics. Prior  Anticoagulants: The                         patient has taken no previous anticoagulant or                         antiplatelet agents. ASA Grade Assessment: II - A                         patient with mild systemic disease. After reviewing                         the risks and benefits, the patient was deemed in                         satisfactory condition to undergo the procedure. The                         anesthesia plan was to use general anesthesia.                         Immediately prior to administration of medications,                         the patient was re-assessed for adequacy to receive                         sedatives. The heart rate, respiratory rate, oxygen                         saturations, blood pressure, adequacy of pulmonary                         ventilation, and response to care were monitored                         throughout the procedure. The physical status of the                         patient was re-assessed after the procedure.                        After obtaining informed consent, the colonoscope was                         passed under direct vision. Throughout the procedure,                         the patient's blood pressure, pulse, and oxygen                         saturations were monitored continuously. The  Colonoscope was introduced through the anus and                         advanced to the the cecum, identified by appendiceal                         orifice and ileocecal valve. The colonoscopy was                         performed without difficulty. The patient tolerated                         the procedure well. The quality of the bowel                         preparation was evaluated using the BBPS [facility] Bowel                         Preparation Scale) with scores of: Right Colon = 3,                         Transverse Colon = 3 and Left Colon = 3 (entire mucosa                         seen well with no  residual staining, small fragments                         of stool or opaque liquid). The total BBPS score                         equals 9. Findings:      The perianal and digital rectal examinations were normal. Pertinent       negatives include normal sphincter tone and no palpable rectal lesions.      A 10 mm polyp was found in the cecum. The polyp was flat. Preparations       were made for mucosal resection. Saline was injected to raise the       lesion. Snare mucosal resection was performed. Resection and retrieval       were complete.      A 3 mm polyp was found in the recto-sigmoid colon. The polyp was       sessile. The polyp was removed with a cold snare. Resection and       retrieval were complete.      The retroflexed view of the distal rectum and anal verge was normal and       showed no anal or rectal abnormalities.      Multiple diverticula were found in the sigmoid colon. Impression:            - One 10 mm polyp in the cecum, removed with mucosal                         resection. Resected and retrieved.                        - One 3 mm polyp at the recto-sigmoid colon, removed  with a cold snare. Resected and retrieved.                        - The distal rectum and anal verge are normal on                         retroflexion view.                        - Diverticulosis in the sigmoid colon.                        - Mucosal resection was performed. Resection and                         retrieval were complete. Recommendation:        - Discharge patient to home (with escort).                        - Resume previous diet today.                        - Continue present medications.                        - Await pathology results.                        - Repeat colonoscopy in 5 years for surveillance. Procedure Code(s):     --- Professional ---                        [phone], Colonoscopy, flexible; with endoscopic mucosal                          resection                        [id number], 29, Colonoscopy, flexible; with removal of                         tumor(s), polyp(s), or other lesion(s) by snare                         technique Diagnosis Code(s):     --- Professional ---                        K63.5, Polyp of colon                        R19.5, Other fecal abnormalities                        K57.30, Diverticulosis of large intestine without                         perforation or abscess without bleeding CPT copyright 2019 [employer]. All rights reserved. The codes documented in this report are preliminary and upon coder review may  be revised to meet current compliance requirements. Dr. Ulyess Mort Lin Landsman MD, MD 10/15/2021 8:21:17 AM This report has been signed electronically. Number of  Addenda: 0 Note Initiated On: 10/15/2021 7:14 AM Scope Withdrawal Time: 0 hours 11 minutes 46 seconds  Total Procedure Duration: 0 hours 15 minutes 54 seconds  Estimated Blood Loss:  Estimated blood loss: none.      [facility]

## 2021-10-15 NOTE — Anesthesia Preprocedure Evaluation (Signed)
Anesthesia Evaluation  Patient identified by MRN, date of birth, ID band Patient awake    Reviewed: Allergy & Precautions, NPO status , Patient's Chart, lab work & pertinent test results  History of Anesthesia Complications (+) PONV and history of anesthetic complications  Airway Mallampati: II  TM Distance: >3 FB Neck ROM: Full    Dental  (+) Upper Dentures   Pulmonary neg pulmonary ROS, neg sleep apnea, neg COPD, Patient abstained from smoking.Not current smoker,    Pulmonary exam normal breath sounds clear to auscultation       Cardiovascular Exercise Tolerance: Good METS(-) hypertension(-) CAD and (-) Past MI negative cardio ROS  (-) dysrhythmias  Rhythm:Regular Rate:Normal - Systolic murmurs    Neuro/Psych PSYCHIATRIC DISORDERS Anxiety negative neurological ROS     GI/Hepatic neg GERD  ,(+)     (-) substance abuse  ,   Endo/Other  neg diabetes  Renal/GU negative Renal ROS     Musculoskeletal   Abdominal   Peds  Hematology   Anesthesia Other Findings Past Medical History: No date: Hyperlipidemia No date: Osteoporosis 10/2019: Palpitations No date: PONV (postoperative nausea and vomiting)     Comment:  after colonoscopy 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  Reproductive/Obstetrics                             Anesthesia Physical Anesthesia Plan  ASA: 2  Anesthesia Plan: General   Post-op Pain Management: Minimal or no pain anticipated   Induction: Intravenous  PONV Risk Score and Plan: 4 or greater and Propofol infusion, TIVA and Ondansetron  Airway Management Planned: Nasal Cannula  Additional Equipment: None  Intra-op Plan:   Post-operative Plan:   Informed Consent: I have reviewed the patients History and Physical, chart, labs and discussed the procedure including the risks, benefits and alternatives for the proposed anesthesia with the patient or authorized representative  who has indicated his/her understanding and acceptance.     Dental advisory given  Plan Discussed with: CRNA and Surgeon  Anesthesia Plan Comments: (Discussed risks of anesthesia with patient, including possibility of difficulty with spontaneous ventilation under anesthesia necessitating airway intervention, PONV, and rare risks such as cardiac or respiratory or neurological events, and allergic reactions. Discussed the role of CRNA in patient's perioperative care. Patient understands.)        Anesthesia Quick Evaluation

## 2021-10-16 ENCOUNTER — Encounter: Payer: Self-pay | Admitting: Gastroenterology

## 2021-10-16 LAB — SURGICAL PATHOLOGY

## 2021-10-17 ENCOUNTER — Encounter: Payer: Self-pay | Admitting: Gastroenterology

## 2021-10-20 ENCOUNTER — Encounter: Payer: Self-pay | Admitting: Family Medicine

## 2022-02-16 ENCOUNTER — Other Ambulatory Visit: Payer: Self-pay | Admitting: Family Medicine

## 2022-02-16 DIAGNOSIS — R002 Palpitations: Secondary | ICD-10-CM

## 2022-05-13 DIAGNOSIS — R002 Palpitations: Secondary | ICD-10-CM | POA: Diagnosis not present

## 2022-05-13 DIAGNOSIS — E7800 Pure hypercholesterolemia, unspecified: Secondary | ICD-10-CM | POA: Diagnosis not present

## 2022-05-13 DIAGNOSIS — I493 Ventricular premature depolarization: Secondary | ICD-10-CM | POA: Diagnosis not present

## 2022-05-20 ENCOUNTER — Other Ambulatory Visit: Payer: Self-pay | Admitting: Family Medicine

## 2022-05-20 DIAGNOSIS — E7800 Pure hypercholesterolemia, unspecified: Secondary | ICD-10-CM

## 2022-06-24 ENCOUNTER — Ambulatory Visit: Payer: Self-pay | Admitting: *Deleted

## 2022-06-24 MED ORDER — NIRMATRELVIR/RITONAVIR (PAXLOVID)TABLET: 3.0000 | ORAL_TABLET | Freq: Two times a day (BID) | ORAL | 0 refills | Status: AC

## 2022-06-24 NOTE — Telephone Encounter (Signed)
If she is not having shortness of breath or high fever then I would recommend Paxlovid, otherwise needs to go to urgent care or ER.

## 2022-06-24 NOTE — Telephone Encounter (Signed)
Called, spoke with patient. She states that she is not having SOB or a high fever. She would like to try the medication paxlovid, if Dr. Caryn Section can prescribe it for her. Please sent mediation to pharmacy on file, [facility] [address] in [place], [place].

## 2022-06-24 NOTE — Telephone Encounter (Signed)
Tried calling patient. Left message to call back. OK for PEC triage to speak with patient.  

## 2022-06-24 NOTE — Telephone Encounter (Signed)
Please Review

## 2022-06-24 NOTE — Telephone Encounter (Signed)
Please review patient's response. Dr. Caryn Section is out of the office this afternoon.

## 2022-06-24 NOTE — Telephone Encounter (Signed)
Its best to start paxlovid ASAP. I've sent in prescription so she can start tonight. Can cancel tomorrows virtual appointment. Go to urgent care if any shortness of breath or if any new sx develop

## 2022-06-24 NOTE — Telephone Encounter (Signed)
Patient advised and verbalized understanding. Virtual visit appt cancelled.

## 2022-06-24 NOTE — Telephone Encounter (Signed)
Per agent:"The patient has tested positive for COVID 19 via an at home test   The patient is experiencing a sore throat, congestion, cough, headache, chills and body aches   The patient shares that their symptoms began 06/22/22 in the evening."   Please contact further when possible   Chief Complaint: Covid positive Symptoms: Headache, sore throat, body aches, dry cough, subjective fever, chills. Tested positive this AM, home test. Frequency: Symptoms onset Monday Pertinent Negatives: Patient denies SOB Disposition: _0 ED /_1 Urgent Care (no appt availability in office) / _2 Appointment(In office/virtual)/ _3  [facility]/ _4 Home Care/ _5 Refused Recommended Disposition /_6 [facility]/ _7  Follow-up with PCP Additional Notes: Home care advise provided, verbalizes understanding. Pt would like Dr. Sabino Snipes input on recommendation of oral anti viral med. Please advise. Reason for Disposition  [1] COVID-19 diagnosed by positive lab test (e.g., PCR, rapid self-test kit) AND [2] mild symptoms (e.g., cough, fever, others) AND [5] no complications or SOB  Answer Assessment - Initial Assessment Questions 1. COVID-19 DIAGNOSIS: "How do you know that you have COVID?" (e.g., positive lab test or self-test, diagnosed by doctor or NP/PA, symptoms after exposure).     Home test this AM 2. COVID-19 EXPOSURE: "Was there any known exposure to COVID before the symptoms began?" CDC Definition of close contact: within 6 feet (2 meters) for a total of 15 minutes or more over a 24-hour period.      No 3. ONSET: "When did the COVID-19 symptoms start?"      2 days ago 06/22/22 4. WORST SYMPTOM: "What is your worst symptom?" (e.g., cough, fever, shortness of breath, muscle aches)     Cough 5. COUGH: "Do you have a cough?" If Yes, ask: "How bad is the cough?"       Yes, dry  6. FEVER: "Do you have a fever?" If Yes, ask: "What is your temperature, how was it measured, and when did it start?"     No,  but feels hot. Temp reads  98.6 , 7. RESPIRATORY STATUS: "Describe your breathing?" (e.g., normal; shortness of breath, wheezing, unable to speak)      None 8. BETTER-SAME-WORSE: "Are you getting better, staying the same or getting worse compared to yesterday?"  If getting worse, ask, "In what way?"     Worse, no sleep, achy 9. OTHER SYMPTOMS: "Do you have any other symptoms?"  (e.g., chills, fatigue, headache, loss of smell or taste, muscle pain, sore throat)     Body aches, sore throat, headache 10. HIGH RISK DISEASE: "Do you have any chronic medical problems?" (e.g., asthma, heart or lung disease, weak immune system, obesity, etc.)        11. VACCINE: "Have you had the COVID-19 vaccine?" If Yes, ask: "Which one, how many shots, when did you get it?"       2 vaccines, 1 booster 13. O2 SATURATION MONITOR:  "Do you use an oxygen saturation monitor (pulse oximeter) at home?" If Yes, ask "What is your reading (oxygen level) today?" "What is your usual oxygen saturation reading?" (e.g., 95%)  Protocols used: Coronavirus (COVID-19) Diagnosed or Suspected-A-AH

## 2022-06-25 ENCOUNTER — Telehealth: Payer: Medicare Other | Admitting: Physician Assistant

## 2022-07-31 ENCOUNTER — Telehealth: Payer: Self-pay | Admitting: Family Medicine

## 2022-07-31 NOTE — Telephone Encounter (Signed)
Copied from [place] [id number]. Topic: Medicare AWV >> Jul 31, 2022 11:39 AM Jae Dire wrote: Reason for CRM:  Left message for patient to call back and schedule Medicare Annual Wellness Visit (AWV) in office.   If unable to come into the office for AWV,  please offer to do virtually or by telephone.  Last AWV: 08/11/2021  Please schedule at anytime with [facility] Advisor.  30 minute appointment for Virtual or phone 45 minute appointment for in office or Initial virtual/phone  Any questions, please contact me at [phone]

## 2022-08-12 ENCOUNTER — Ambulatory Visit (INDEPENDENT_AMBULATORY_CARE_PROVIDER_SITE_OTHER): Payer: Medicare Other

## 2022-08-12 VITALS — BP 132/70 | Ht 64.0 in | Wt 155.5 lb

## 2022-08-12 DIAGNOSIS — Z0000 Encounter for general adult medical examination without abnormal findings: Secondary | ICD-10-CM | POA: Diagnosis not present

## 2022-08-12 NOTE — Progress Notes (Signed)
Subjective:   Caitlyn Baker is a 71 y.o. female who presents for Medicare Annual (Subsequent) preventive examination.  Review of Systems     Cardiac Risk Factors include: advanced age (>49mn, >>58women);dyslipidemia;hypertension     Objective:    Today's Vitals   08/12/22 1000  BP: 132/70  Weight: 155 lb 8 oz (70.5 kg)  Height: '5\' 4"'$  (1.626 m)   Body mass index is 26.69 kg/m.     08/12/2022   10:06 AM 10/15/2021    6:48 AM 12/31/2020   12:07 PM 04/17/2020   10:28 AM 04/12/2019    8:57 AM 04/05/2018    3:20 PM 03/03/2017    9:18 AM  Advanced Directives  Does Patient Have a Medical Advance Directive? No Yes Yes Yes Yes Yes Yes  Type of Advance Directive  [place] Living will  Does patient want to make changes to medical advance directive?   No - Patient declined    No - Patient declined  Copy of [place] Chart?   No - copy requested No - copy requested No - copy requested No - copy requested   Would patient like information on creating a medical advance directive? No - Patient declined          Current Medications (verified) Outpatient Encounter Medications as of 08/12/2022  Medication Sig   alendronate (FOSAMAX) 70 MG tablet TAKE 1 TABLET (70 MG TOTAL) BY MOUTH EVERY 7 (SEVEN) DAYS. TAKE WITH A FULL GLASS OF WATER ON AN EMPTY STOMACH.   aspirin 81 MG tablet Take 81 mg by mouth daily.   Calcium Carbonate-Vitamin D 600-400 MG-UNIT tablet Take 1 tablet by mouth daily.   cholecalciferol (VITAMIN D3) 25 MCG (1000 UNIT) tablet Take 1,000 Units by mouth daily.   Coenzyme Q10 (COQ10 PO) Take 1 capsule by mouth daily.   lovastatin (MEVACOR) 20 MG tablet TAKE 1 TABLET BY MOUTH EVERY DAY   Magnesium 250 MG TABS Take 250 mg by mouth daily.   metoprolol succinate (TOPROL-XL) 25 MG 24 hr tablet TAKE 1/2  TABLET BY MOUTH EVERY DAY   MULTIPLE VITAMIN PO Take 1 tablet by mouth daily.   No facility-administered encounter medications on file as of 08/12/2022.    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   Past Surgical History:  Procedure Laterality Date   COLONOSCOPY  2005   COLONOSCOPY WITH PROPOFOL N/A 10/15/2021   Procedure: COLONOSCOPY WITH PROPOFOL;  Surgeon: VLin Landsman MD;  [facility]  Service: Gastroenterology;  Laterality: N/A;   MOUTH SURGERY  1980's   Dental work   RETINAL LASER PROCEDURE Right    torn retina   XI ROBOTIC ASSISTED INGUINAL HERNIA REPAIR WITH MESH Bilateral 01/07/2021   Procedure: XI ROBOTIC ASSISTED INGUINAL HERNIA REPAIR WITH MESH;  Surgeon: POlean Ree MD;  Location: [facility] OR[facility];  Service: General;  Laterality: Bilateral;   Family History  Problem Relation Age of Onset   Hypertension Mother    Breast cancer Neg Hx    Social History   Socioeconomic History   Marital status: Married    Spouse name: Caitlyn Baker  Number of children: 3  Years of education: Not on file   Highest education level: Some college, no degree  Occupational History   Occupation: retired  Tobacco Use   Smoking status: Never   Smokeless tobacco: Never  Vaping Use   Vaping Use: Never used  Substance and Sexual Activity   Alcohol use: No   Drug use: No   Sexual activity: Not on file  Other Topics Concern   Not on file  Social History Narrative   Not on file   Social Determinants of Health   Financial Resource Strain: Low Risk  (08/12/2022)   Overall Financial Resource Strain (CARDIA)    Difficulty of Paying Living Expenses: Not hard at all  Food Insecurity: No Food Insecurity (08/12/2022)   Hunger Vital Sign    Worried About Running Out of Food in  the Last Year: Never true    [place] in the Last Year: Never true  Transportation Needs: No Transportation Needs (08/12/2022)   PRAPARE - Hydrologist (Medical): No    Lack of Transportation (Non-Medical): No  Physical Activity: Insufficiently Active (08/12/2022)   Exercise Vital Sign    Days of Exercise per Week: 3 days    Minutes of Exercise per Session: 30 min  Stress: No Stress Concern Present (08/12/2022)   [place]    Feeling of Stress : Not at all  Social Connections: Moderately Integrated (08/12/2022)   Social Connection and Isolation Panel [NHANES]    Frequency of Communication with Friends and Family: More than three times a week    Frequency of Social Gatherings with Friends and Family: Three times a week    Attends Religious Services: More than 4 times per year    Active Member of Clubs or Organizations: No    Attends Archivist Meetings: Never    Marital Status: Married    Tobacco Counseling Counseling given: Not Answered   Clinical Intake:  Pre-visit preparation completed: Yes  Pain : No/denies pain     Nutritional Risks: None Diabetes: No  How often do you need to have someone help you when you read instructions, pamphlets, or other written materials from your doctor or pharmacy?: 1 - Never  Diabetic?no  Interpreter Needed?: No  Information entered by :: Kirke Shaggy, LPN   Activities of Daily Living    08/12/2022   10:08 AM 08/10/2022    7:51 AM  In your present state of health, do you have any difficulty performing the following activities:  Hearing? 0 0  Vision? 0 0  Difficulty concentrating or making decisions? 0 0  Walking or climbing stairs? 0 0  Dressing or bathing? 0 0  Doing errands, shopping? 0 0  Preparing Food and eating ? N N  Using the Toilet? N N  In the past six months, have you accidently leaked urine? N N  Do  you have problems with loss of bowel control? N N  Managing your Medications? N N  Managing your Finances? N N  Housekeeping or managing your Housekeeping? N N    Patient Care Team: Birdie Sons, MD as PCP - General (Family Medicine) Ubaldo Glassing Javier Docker, MD as Consulting Physician (Cardiology) [place], [facility]) Marius Ditch, Tally Due, MD as Consulting Physician (Gastroenterology)  Indicate any recent Medical Services you may have received from other than [facility] providers in the past year (date may be approximate).     Assessment:  This is a routine wellness examination for [place].  Hearing/Vision screen Hearing Screening - Comments:: No aids Vision Screening - Comments:: Readers-  Eye  Dietary issues and exercise activities discussed: Current Exercise Habits: Home exercise routine, Type of exercise: walking, Time (Minutes): 30, Frequency (Times/Week): 3, Weekly Exercise (Minutes/Week): 90, Intensity: Mild   Goals Addressed             This Visit's Progress    DIET - EAT MORE FRUITS AND VEGETABLES         Depression Screen    08/12/2022   10:04 AM 08/11/2021    9:09 AM 01/01/2021    8:20 AM 04/17/2020   10:25 AM 04/12/2019    8:57 AM 04/05/2018    3:21 PM 03/03/2017    9:19 AM  PHQ 2/9 Scores  PHQ - 2 Score 0 0 0 0 0 0 0  PHQ- 9 Score 0 '1 1    4    '$ Fall Risk    08/12/2022   10:07 AM 08/10/2022    7:51 AM 08/11/2021    9:09 AM 01/01/2021    8:19 AM 11/11/2020    2:02 PM  Fall Risk   Falls in the past year? '1 1 1 1 '$ 0  Number falls in past yr: 0 0 1 0   Injury with Fall? 0 0 0 0   Risk for fall due to : History of fall(s)  History of fall(s)    Follow up Falls evaluation completed;Falls prevention discussed  Falls evaluation completed Falls evaluation completed     FALL RISK PREVENTION PERTAINING TO THE HOME:  Any stairs in or around the home? No  If so, are there any without handrails? No  Home free of loose throw rugs in walkways, pet beds,  electrical cords, etc? Yes  Adequate lighting in your home to reduce risk of falls? Yes   ASSISTIVE DEVICES UTILIZED TO PREVENT FALLS:  Life alert? No  Use of a cane, walker or w/c? No  Grab bars in the bathroom? No  Shower chair or bench in shower? No  Elevated toilet seat or a handicapped toilet? No   TIMED UP AND GO:  Was the test performed? Yes .  Length of time to ambulate 10 feet: 4 sec.   Gait steady and fast without use of assistive device  Cognitive Function:        08/12/2022   10:11 AM 04/17/2020   10:35 AM 04/12/2019    9:01 AM 04/05/2018    3:24 PM  6CIT Screen  What Year? 0 points 0 points 0 points 0 points  What month? 0 points 0 points 0 points 0 points  What time? 0 points 0 points 0 points 0 points  Count back from 20 0 points 0 points 0 points 0 points  Months in reverse 0 points 0 points 0 points 0 points  Repeat phrase 0 points 0 points 0 points 0 points  Total Score 0 points 0 points 0 points 0 points    Immunizations Immunization History  Administered Date(s) Administered   Fluad Quad(high Dose 65+) 06/28/2019, 07/16/2020, 08/11/2021   Influenza Split 07/19/2010   Influenza,inj,Quad PF,6+ Mos 07/31/2013   Influenza-Unspecified 09/04/2015, 08/16/2017, 09/01/2018   PFIZER Comirnaty(Gray Top)Covid-19 Tri-Sucrose Vaccine 03/18/2021   PFIZER(Purple Top)SARS-COV-2 Vaccination 12/29/2019, 01/23/2020   Pneumococcal Conjugate-13 11/22/2015   Pneumococcal Polysaccharide-23 03/03/2017   Td 08/06/2004, 10/12/2014   Tdap 10/12/2014    TDAP status: Up to date  Flu Vaccine status: Up to  date  Pneumococcal vaccine status: Up to date  Covid-19 vaccine status: Completed vaccines  Qualifies for Shingles Vaccine? Yes   Zostavax completed No   Shingrix Completed?: No.    Education has been provided regarding the importance of this vaccine. Patient has been advised to call insurance company to determine out of pocket expense if they have not yet received  this vaccine. Advised may also receive vaccine at local pharmacy or Health Dept. Verbalized acceptance and understanding.  Screening Tests Health Maintenance  Topic Date Due   Zoster Vaccines- Shingrix (1 of 2) Never done   COVID-19 Vaccine (4 - Pfizer series) 05/13/2021   INFLUENZA VACCINE  06/02/2022   MAMMOGRAM  09/22/2022   DEXA SCAN  09/04/2023   TETANUS/TDAP  10/12/2024   COLONOSCOPY (Pts 45-59yr Insurance coverage will need to be confirmed)  10/15/2024   Pneumonia Vaccine 72 Years old  Completed   Hepatitis C Screening  Completed   HPV VACCINES  Aged Out   Fecal DNA (Cologuard)  Discontinued    Health Maintenance  Health Maintenance Due  Topic Date Due   Zoster Vaccines- Shingrix (1 of 2) Never done   COVID-19 Vaccine (4 - Pfizer series) 05/13/2021   INFLUENZA VACCINE  06/02/2022    Colorectal cancer screening: Type of screening: Colonoscopy. Completed 10/15/21. Repeat every 3 years  Mammogram status: Completed 09/22/21. Repeat every year- will make appt for this Nov.  Bone Density status: Completed 09/03/21. Results reflect: Bone density results: OSTEOPOROSIS. Repeat every 2 years.  Lung Cancer Screening: (Low Dose CT Chest recommended if Age 72-80years, 30 pack-year currently smoking OR have quit w/in 15years.) does not qualify.    Additional Screening:  Hepatitis C Screening: does qualify; Completed 06/08/12  Vision Screening: Recommended annual ophthalmology exams for early detection of glaucoma and other disorders of the eye. Is the patient up to date with their annual eye exam?  Yes  Who is the provider or what is the name of the office in which the patient attends annual eye exams? [facility] is not established with a provider, would they like to be referred to a provider to establish care? No .   Dental Screening: Recommended annual dental exams for proper oral hygiene  Community Resource Referral / Chronic Care Management: CRR required this  visit?  No   CCM required this visit?  No      Plan:     I have personally reviewed and noted the following in the patient's chart:   Medical and social history Use of alcohol, tobacco or illicit drugs  Current medications and supplements including opioid prescriptions. Patient is not currently taking opioid prescriptions. Functional ability and status Nutritional status Physical activity Advanced directives List of other physicians Hospitalizations, surgeries, and ER visits in previous 12 months Vitals Screenings to include cognitive, depression, and falls Referrals and appointments  In addition, I have reviewed and discussed with patient certain preventive protocols, quality metrics, and best practice recommendations. A written personalized care plan for preventive services as well as general preventive health recommendations were provided to patient.     LDionisio David LPN   [id number]  Nurse Notes: none

## 2022-08-12 NOTE — Patient Instructions (Signed)
Caitlyn Baker , Thank you for taking time to come for your Medicare Wellness Visit. I appreciate your ongoing commitment to your health goals. Please review the following plan we discussed and let me know if I can assist you in the future.   Screening recommendations/referrals: Colonoscopy: 10/15/21 Mammogram: 09/22/21 Bone Density: 09/03/21 Recommended yearly ophthalmology/optometry visit for glaucoma screening and checkup Recommended yearly dental visit for hygiene and checkup  Vaccinations: Influenza vaccine: 08/11/21 Pneumococcal vaccine: 03/03/17 Tdap vaccine: 10/12/14 Shingles vaccine: n/d   Covid-19:12/29/19, 01/23/20, 03/18/21  Advanced directives: no  Conditions/risks identified: none  Next appointment: Follow up in one year for your annual wellness visit 08/16/23 @ 10 am in person   Preventive Care 65 Years and Older, Female Preventive care refers to lifestyle choices and visits with your health care provider that can promote health and wellness. What does preventive care include? A yearly physical exam. This is also called an annual well check. Dental exams once or twice a year. Routine eye exams. Ask your health care provider how often you should have your eyes checked. Personal lifestyle choices, including: Daily care of your teeth and gums. Regular physical activity. Eating a healthy diet. Avoiding tobacco and drug use. Limiting alcohol use. Practicing safe sex. Taking low-dose aspirin every day. Taking vitamin and mineral supplements as recommended by your health care provider. What happens during an annual well check? The services and screenings done by your health care provider during your annual well check will depend on your age, overall health, lifestyle risk factors, and family history of disease. Counseling  Your health care provider may ask you questions about your: Alcohol use. Tobacco use. Drug use. Emotional well-being. Home and relationship  well-being. Sexual activity. Eating habits. History of falls. Memory and ability to understand (cognition). Work and work Statistician. Reproductive health. Screening  You may have the following tests or measurements: Height, weight, and BMI. Blood pressure. Lipid and cholesterol levels. These may be checked every 5 years, or more frequently if you are over 30 years old. Skin check. Lung cancer screening. You may have this screening every year starting at age 81 if you have a 30-pack-year history of smoking and currently smoke or have quit within the past 15 years. Fecal occult blood test (FOBT) of the stool. You may have this test every year starting at age 32. Flexible sigmoidoscopy or colonoscopy. You may have a sigmoidoscopy every 5 years or a colonoscopy every 10 years starting at age 12. Hepatitis C blood test. Hepatitis B blood test. Sexually transmitted disease (STD) testing. Diabetes screening. This is done by checking your blood sugar (glucose) after you have not eaten for a while (fasting). You may have this done every 1-3 years. Bone density scan. This is done to screen for osteoporosis. You may have this done starting at age 33. Mammogram. This may be done every 1-2 years. Talk to your health care provider about how often you should have regular mammograms. Talk with your health care provider about your test results, treatment options, and if necessary, the need for more tests. Vaccines  Your health care provider may recommend certain vaccines, such as: Influenza vaccine. This is recommended every year. Tetanus, diphtheria, and acellular pertussis (Tdap, Td) vaccine. You may need a Td booster every 10 years. Zoster vaccine. You may need this after age 38. Pneumococcal 13-valent conjugate (PCV13) vaccine. One dose is recommended after age 81. Pneumococcal polysaccharide (PPSV23) vaccine. One dose is recommended after age 46. Talk to your health care  provider about which  screenings and vaccines you need and how often you need them. This information is not intended to replace advice given to you by your health care provider. Make sure you discuss any questions you have with your health care provider. Document Released: 11/15/2015 Document Revised: 07/08/2016 Document Reviewed: 08/20/2015 Elsevier Interactive Patient Education  2017 [place] Prevention in the Home Falls can cause injuries. They can happen to people of all ages. There are many things you can do to make your home safe and to help prevent falls. What can I do on the outside of my home? Regularly fix the edges of walkways and driveways and fix any cracks. Remove anything that might make you trip as you walk through a door, such as a raised step or threshold. Trim any bushes or trees on the path to your home. Use bright outdoor lighting. Clear any walking paths of anything that might make someone trip, such as rocks or tools. Regularly check to see if handrails are loose or broken. Make sure that both sides of any steps have handrails. Any raised decks and porches should have guardrails on the edges. Have any leaves, snow, or ice cleared regularly. Use sand or salt on walking paths during winter. Clean up any spills in your garage right away. This includes oil or grease spills. What can I do in the bathroom? Use night lights. Install grab bars by the toilet and in the tub and shower. Do not use towel bars as grab bars. Use non-skid mats or decals in the tub or shower. If you need to sit down in the shower, use a plastic, non-slip stool. Keep the floor dry. Clean up any water that spills on the floor as soon as it happens. Remove soap buildup in the tub or shower regularly. Attach bath mats securely with double-sided non-slip rug tape. Do not have throw rugs and other things on the floor that can make you trip. What can I do in the bedroom? Use night lights. Make sure that you have a  light by your bed that is easy to reach. Do not use any sheets or blankets that are too big for your bed. They should not hang down onto the floor. Have a firm chair that has side arms. You can use this for support while you get dressed. Do not have throw rugs and other things on the floor that can make you trip. What can I do in the kitchen? Clean up any spills right away. Avoid walking on wet floors. Keep items that you use a lot in easy-to-reach places. If you need to reach something above you, use a strong step stool that has a grab bar. Keep electrical cords out of the way. Do not use floor polish or wax that makes floors slippery. If you must use wax, use non-skid floor wax. Do not have throw rugs and other things on the floor that can make you trip. What can I do with my stairs? Do not leave any items on the stairs. Make sure that there are handrails on both sides of the stairs and use them. Fix handrails that are broken or loose. Make sure that handrails are as long as the stairways. Check any carpeting to make sure that it is firmly attached to the stairs. Fix any carpet that is loose or worn. Avoid having throw rugs at the top or bottom of the stairs. If you do have throw rugs, attach them to the  floor with carpet tape. Make sure that you have a light switch at the top of the stairs and the bottom of the stairs. If you do not have them, ask someone to add them for you. What else can I do to help prevent falls? Wear shoes that: Do not have high heels. Have rubber bottoms. Are comfortable and fit you well. Are closed at the toe. Do not wear sandals. If you use a stepladder: Make sure that it is fully opened. Do not climb a closed stepladder. Make sure that both sides of the stepladder are locked into place. Ask someone to hold it for you, if possible. Clearly mark and make sure that you can see: Any grab bars or handrails. First and last steps. Where the edge of each step  is. Use tools that help you move around (mobility aids) if they are needed. These include: Canes. Walkers. Scooters. Crutches. Turn on the lights when you go into a dark area. Replace any light bulbs as soon as they burn out. Set up your furniture so you have a clear path. Avoid moving your furniture around. If any of your floors are uneven, fix them. If there are any pets around you, be aware of where they are. Review your medicines with your doctor. Some medicines can make you feel dizzy. This can increase your chance of falling. Ask your doctor what other things that you can do to help prevent falls. This information is not intended to replace advice given to you by your health care provider. Make sure you discuss any questions you have with your health care provider. Document Released: 08/15/2009 Document Revised: 03/26/2016 Document Reviewed: 11/23/2014 Elsevier Interactive Patient Education  2017 [employer].

## 2022-09-07 ENCOUNTER — Encounter: Payer: Self-pay | Admitting: Family Medicine

## 2022-09-07 ENCOUNTER — Ambulatory Visit (INDEPENDENT_AMBULATORY_CARE_PROVIDER_SITE_OTHER): Payer: Medicare Other | Admitting: Family Medicine

## 2022-09-07 VITALS — BP 138/61 | HR 84 | Temp 98.4°F | Resp 16 | Ht 64.0 in | Wt 156.3 lb

## 2022-09-07 DIAGNOSIS — Z23 Encounter for immunization: Secondary | ICD-10-CM

## 2022-09-07 DIAGNOSIS — M810 Age-related osteoporosis without current pathological fracture: Secondary | ICD-10-CM

## 2022-09-07 DIAGNOSIS — E7800 Pure hypercholesterolemia, unspecified: Secondary | ICD-10-CM | POA: Diagnosis not present

## 2022-09-07 DIAGNOSIS — E559 Vitamin D deficiency, unspecified: Secondary | ICD-10-CM

## 2022-09-07 MED ORDER — ALENDRONATE SODIUM 70 MG PO TABS: 70.0000 mg | ORAL_TABLET | ORAL | 3 refills | Status: DC

## 2022-09-07 NOTE — Progress Notes (Signed)
I,[name],acting as a scribe for Lelon Huh, MD.,have documented all relevant documentation on the behalf of Lelon Huh, MD,as directed by  Lelon Huh, MD while in the presence of Lelon Huh, MD.   Established patient visit   Patient: Caitlyn Baker   DOB: [birth year]   71 y.o. Female  MRN: [id number] Visit Date: 09/07/2022  Today's healthcare provider: Lelon Huh, MD   Chief Complaint  Patient presents with   Hyperlipidemia   Subjective    HPI  Patient had AWV with [facility] on 08/12/2022. She is due for breast exam for breast cancer screening, she is due for mammogram this month.   Lipid/Cholesterol, Follow-up  Last lipid panel Other pertinent labs  Lab Results  Component Value Date   CHOL 209 (H) 08/11/2021   HDL 57 08/11/2021   LDLCALC 128 (H) 08/11/2021   TRIG 138 08/11/2021   CHOLHDL 3.7 08/11/2021   Lab Results  Component Value Date   ALT 12 08/11/2021   AST 15 08/11/2021   PLT 280 08/11/2021   TSH 1.540 08/11/2021     She was last seen for this 1 years ago.  Management since that visit includes; labs checked showing improvement.  She reports good compliance with treatment. She is not having side effects.   The 10-year ASCVD risk score (Arnett DK, et al., 2019) is: 13.5%  ---------------------------------------------------------------------------------------------------   Medications: Outpatient Medications Prior to Visit  Medication Sig   alendronate (FOSAMAX) 70 MG tablet TAKE 1 TABLET (70 MG TOTAL) BY MOUTH EVERY 7 (SEVEN) DAYS. TAKE WITH A FULL GLASS OF WATER ON AN EMPTY STOMACH.   aspirin 81 MG tablet Take 81 mg by mouth daily.   Calcium Carbonate-Vitamin D 600-400 MG-UNIT tablet Take 1 tablet by mouth daily.   cholecalciferol (VITAMIN D3) 25 MCG (1000 UNIT) tablet Take 1,000 Units by mouth daily.   Coenzyme Q10 (COQ10 PO) Take 1 capsule by mouth daily.   lovastatin (MEVACOR) 20 MG tablet TAKE 1 TABLET BY MOUTH EVERY DAY    Magnesium 250 MG TABS Take 250 mg by mouth daily.   metoprolol succinate (TOPROL-XL) 25 MG 24 hr tablet TAKE 1/2 TABLET BY MOUTH EVERY DAY   MULTIPLE VITAMIN PO Take 1 tablet by mouth daily.   No facility-administered medications prior to visit.    Review of Systems  Constitutional:  Negative for appetite change, chills, fatigue and fever.  Respiratory:  Negative for chest tightness and shortness of breath.   Cardiovascular:  Negative for chest pain and palpitations.  Gastrointestinal:  Negative for abdominal pain, nausea and vomiting.  Neurological:  Negative for dizziness and weakness.  All other systems reviewed and are negative.      Objective    BP 138/61 (BP Location: Left Arm, Patient Position: Sitting, Cuff Size: Normal)   Pulse 84   Temp 98.4 F (36.9 C) (Oral)   Resp 16   Ht '5\' 4"'$  (1.626 m)   Wt 156 lb 4.8 oz (70.9 kg)   SpO2 98% Comment: room air  BMI 26.83 kg/m    Physical Exam   General: Appearance:    Well developed, well nourished female in no acute distress  Eyes:    PERRL, conjunctiva/corneas clear, EOM's intact       Breast:   No masses, tenderness, asymmetry or dimpling.   Lungs:     Clear to auscultation bilaterally, respirations unlabored  Heart:    Normal heart rate. Normal rhythm. No murmurs, rubs, or gallops.  MS:   All extremities are intact.    Neurologic:   Awake, alert, oriented x 3. No apparent focal neurological defect.         Assessment & Plan     1. Hypercholesteremia She is tolerating lovastatin well with no adverse effects.   - CBC - Lipid panel - TSH - Renal function panel  2. Vitamin D deficiency  - VITAMIN D 25 Hydroxy (Vit-D Deficiency, Fractures)  3. Age related osteoporosis, unspecified pathological fracture presence refill alendronate (FOSAMAX) 70 MG tablet; Take 1 tablet (70 mg total) by mouth every 7 (seven) days. Take with a full glass of water on an empty stomach.  Dispense: 12 tablet; Refill: 3  BMD in 2024.    4. Need for immunization against influenza  - Flu Vaccine QUAD High Dose(Fluad)      The entirety of the information documented in the History of Present Illness, Review of Systems and Physical Exam were personally obtained by me. Portions of this information were initially documented by the CMA and reviewed by me for thoroughness and accuracy.     Lelon Huh, MD  [facility] [phone] (phone) [phone] (fax)  [place]

## 2022-09-08 ENCOUNTER — Other Ambulatory Visit: Payer: Self-pay | Admitting: Family Medicine

## 2022-09-08 DIAGNOSIS — E7800 Pure hypercholesterolemia, unspecified: Secondary | ICD-10-CM | POA: Diagnosis not present

## 2022-09-08 DIAGNOSIS — Z1231 Encounter for screening mammogram for malignant neoplasm of breast: Secondary | ICD-10-CM

## 2022-09-08 DIAGNOSIS — E559 Vitamin D deficiency, unspecified: Secondary | ICD-10-CM | POA: Diagnosis not present

## 2022-09-09 LAB — RENAL FUNCTION PANEL
Albumin: 4.4 g/dL (ref 3.8–4.8)
BUN/Creatinine Ratio: 22 (ref 12–28)
BUN: 15 mg/dL (ref 8–27)
CO2: 24 mmol/L (ref 20–29)
Calcium: 10.5 mg/dL — ABNORMAL HIGH (ref 8.7–10.3)
Chloride: 103 mmol/L (ref 96–106)
Creatinine, Ser: 0.69 mg/dL (ref 0.57–1.00)
Glucose: 97 mg/dL (ref 70–99)
Phosphorus: 2.8 mg/dL — ABNORMAL LOW (ref 3.0–4.3)
Potassium: 4.5 mmol/L (ref 3.5–5.2)
Sodium: 141 mmol/L (ref 134–144)
eGFR: 92 mL/min/{1.73_m2} (ref >59)

## 2022-09-09 LAB — TSH: TSH: 1.63 u[IU]/mL (ref 0.450–4.500)

## 2022-09-09 LAB — CBC
Hematocrit: 38 % (ref 34.0–46.6)
Hemoglobin: 12.7 g/dL (ref 11.1–15.9)
MCH: 32 pg (ref 26.6–33.0)
MCHC: 33.4 g/dL (ref 31.5–35.7)
MCV: 96 fL (ref 79–97)
Platelets: 289 10*3/uL (ref 150–450)
RBC: 3.97 x10E6/uL (ref 3.77–5.28)
RDW: 11.9 % (ref 11.7–15.4)
WBC: 8 10*3/uL (ref 3.4–10.8)

## 2022-09-09 LAB — VITAMIN D 25 HYDROXY (VIT D DEFICIENCY, FRACTURES): Vit D, 25-Hydroxy: 32 ng/mL (ref 30.0–100.0)

## 2022-09-09 LAB — LIPID PANEL
Chol/HDL Ratio: 3.9 ratio (ref 0.0–4.4)
Cholesterol, Total: 211 mg/dL — ABNORMAL HIGH (ref 100–199)
HDL: 54 mg/dL (ref >39)
LDL Chol Calc (NIH): 123 mg/dL — ABNORMAL HIGH (ref 0–99)
Triglycerides: 194 mg/dL — ABNORMAL HIGH (ref 0–149)
VLDL Cholesterol Cal: 34 mg/dL (ref 5–40)

## 2022-10-07 ENCOUNTER — Ambulatory Visit
Admission: RE | Admit: 2022-10-07 | Discharge: 2022-10-07 | Disposition: A | Discharge status: Home / Self Care | Payer: Medicare Other | Source: Ambulatory Visit | Attending: Family Medicine | Admitting: Family Medicine

## 2022-10-07 DIAGNOSIS — Z1231 Encounter for screening mammogram for malignant neoplasm of breast: Secondary | ICD-10-CM | POA: Diagnosis not present

## 2023-01-27 DIAGNOSIS — H2513 Age-related nuclear cataract, bilateral: Secondary | ICD-10-CM | POA: Diagnosis not present

## 2023-01-27 DIAGNOSIS — Z9889 Other specified postprocedural states: Secondary | ICD-10-CM | POA: Diagnosis not present

## 2023-01-27 DIAGNOSIS — D3132 Benign neoplasm of left choroid: Secondary | ICD-10-CM | POA: Diagnosis not present

## 2023-01-27 DIAGNOSIS — H43813 Vitreous degeneration, bilateral: Secondary | ICD-10-CM | POA: Diagnosis not present

## 2023-02-12 ENCOUNTER — Other Ambulatory Visit: Payer: Self-pay | Admitting: Family Medicine

## 2023-02-12 DIAGNOSIS — E7800 Pure hypercholesterolemia, unspecified: Secondary | ICD-10-CM

## 2023-03-09 NOTE — Progress Notes (Unsigned)
   Vivien Rota DeSanto,acting as a scribe for Mila Merry, MD.,have documented all relevant documentation on the behalf of Mila Merry, MD,as directed by  Mila Merry, MD while in the presence of Mila Merry, MD.     Established patient visit   Patient: Caitlyn Baker   DOB: 07-May-1951   72 y.o. Female  MRN: [id number] Visit Date: 03/10/2023  Today's healthcare provider: Mila Merry, MD   No chief complaint on file.  Subjective    HPI  ***  Medications: Outpatient Medications Prior to Visit  Medication Sig   alendronate (FOSAMAX) 70 MG tablet Take 1 tablet (70 mg total) by mouth every 7 (seven) days. Take with a full glass of water on an empty stomach.   aspirin 81 MG tablet Take 81 mg by mouth daily.   Calcium Carbonate-Vitamin D 600-400 MG-UNIT tablet Take 1 tablet by mouth daily.   cholecalciferol (VITAMIN D3) 25 MCG (1000 UNIT) tablet Take 1,000 Units by mouth daily.   Coenzyme Q10 (COQ10 PO) Take 1 capsule by mouth daily.   lovastatin (MEVACOR) 20 MG tablet TAKE 1 TABLET BY MOUTH EVERY DAY   Magnesium 250 MG TABS Take 250 mg by mouth daily.   metoprolol succinate (TOPROL-XL) 25 MG 24 hr tablet TAKE 1/2 TABLET BY MOUTH EVERY DAY   MULTIPLE VITAMIN PO Take 1 tablet by mouth daily.   No facility-administered medications prior to visit.    Review of Systems  {Labs  Heme  Chem  Endocrine  Serology  Results Review (optional):23779}   Objective    There were no vitals taken for this visit. {Show previous vital signs (optional):23777}  Physical Exam  ***  No results found for any visits on 03/10/23.  Assessment & Plan     ***  No follow-ups on file.      {provider attestation***:1}   Mila Merry, MD  [facility] [phone] (phone) [phone] (fax)  [facility]

## 2023-03-10 ENCOUNTER — Ambulatory Visit (INDEPENDENT_AMBULATORY_CARE_PROVIDER_SITE_OTHER): Payer: Medicare Other | Admitting: Family Medicine

## 2023-03-10 VITALS — BP 132/69 | HR 73 | Temp 98.4°F | Wt 156.0 lb

## 2023-03-10 DIAGNOSIS — E559 Vitamin D deficiency, unspecified: Secondary | ICD-10-CM

## 2023-03-10 DIAGNOSIS — E7800 Pure hypercholesterolemia, unspecified: Secondary | ICD-10-CM | POA: Diagnosis not present

## 2023-03-10 DIAGNOSIS — R739 Hyperglycemia, unspecified: Secondary | ICD-10-CM | POA: Diagnosis not present

## 2023-03-10 DIAGNOSIS — I493 Ventricular premature depolarization: Secondary | ICD-10-CM

## 2023-03-10 DIAGNOSIS — M810 Age-related osteoporosis without current pathological fracture: Secondary | ICD-10-CM | POA: Diagnosis not present

## 2023-03-10 NOTE — Patient Instructions (Signed)
.   Please review the attached list of medications and notify my office if there are any errors.   . Please bring all of your medications to every appointment so we can make sure that our medication list is the same as yours.   

## 2023-03-11 LAB — RENAL FUNCTION PANEL
Albumin: 4.3 g/dL (ref 3.8–4.8)
BUN/Creatinine Ratio: 17 (ref 12–28)
BUN: 12 mg/dL (ref 8–27)
CO2: 22 mmol/L (ref 20–29)
Calcium: 10.6 mg/dL — ABNORMAL HIGH (ref 8.7–10.3)
Chloride: 106 mmol/L (ref 96–106)
Creatinine, Ser: 0.69 mg/dL (ref 0.57–1.00)
Glucose: 98 mg/dL (ref 70–99)
Phosphorus: 2.6 mg/dL — ABNORMAL LOW (ref 3.0–4.3)
Potassium: 4.8 mmol/L (ref 3.5–5.2)
Sodium: 142 mmol/L (ref 134–144)
eGFR: 92 mL/min/{1.73_m2} (ref >59)

## 2023-03-11 LAB — LIPID PANEL
Chol/HDL Ratio: 3.8 ratio (ref 0.0–4.4)
Cholesterol, Total: 219 mg/dL — ABNORMAL HIGH (ref 100–199)
HDL: 57 mg/dL (ref >39)
LDL Chol Calc (NIH): 133 mg/dL — ABNORMAL HIGH (ref 0–99)
Triglycerides: 162 mg/dL — ABNORMAL HIGH (ref 0–149)
VLDL Cholesterol Cal: 29 mg/dL (ref 5–40)

## 2023-03-11 LAB — HEMOGLOBIN A1C
Est. average glucose Bld gHb Est-mCnc: 126 mg/dL
Hgb A1c MFr Bld: 6 % — ABNORMAL HIGH (ref 4.8–5.6)

## 2023-03-11 LAB — VITAMIN D 25 HYDROXY (VIT D DEFICIENCY, FRACTURES): Vit D, 25-Hydroxy: 36.6 ng/mL (ref 30.0–100.0)

## 2023-03-11 LAB — MAGNESIUM: Magnesium: 1.9 mg/dL (ref 1.6–2.3)

## 2023-05-14 ENCOUNTER — Other Ambulatory Visit: Payer: Self-pay | Admitting: Family Medicine

## 2023-05-14 DIAGNOSIS — R002 Palpitations: Secondary | ICD-10-CM

## 2023-05-28 DIAGNOSIS — R002 Palpitations: Secondary | ICD-10-CM | POA: Diagnosis not present

## 2023-05-28 DIAGNOSIS — E7800 Pure hypercholesterolemia, unspecified: Secondary | ICD-10-CM | POA: Diagnosis not present

## 2023-05-28 DIAGNOSIS — I493 Ventricular premature depolarization: Secondary | ICD-10-CM | POA: Diagnosis not present

## 2023-07-09 DIAGNOSIS — J019 Acute sinusitis, unspecified: Secondary | ICD-10-CM | POA: Diagnosis not present

## 2023-07-09 DIAGNOSIS — R051 Acute cough: Secondary | ICD-10-CM | POA: Diagnosis not present

## 2023-07-09 DIAGNOSIS — B9689 Other specified bacterial agents as the cause of diseases classified elsewhere: Secondary | ICD-10-CM | POA: Diagnosis not present

## 2023-08-16 ENCOUNTER — Ambulatory Visit (INDEPENDENT_AMBULATORY_CARE_PROVIDER_SITE_OTHER): Payer: Medicare Other

## 2023-08-16 VITALS — BP 122/68 | Ht 64.0 in | Wt 156.0 lb

## 2023-08-16 DIAGNOSIS — Z0000 Encounter for general adult medical examination without abnormal findings: Secondary | ICD-10-CM

## 2023-08-16 DIAGNOSIS — Z23 Encounter for immunization: Secondary | ICD-10-CM | POA: Diagnosis not present

## 2023-08-16 NOTE — Progress Notes (Addendum)
Subjective:   Caitlyn Baker is a 72 y.o. female who presents for Medicare Annual (Subsequent) preventive examination.  Visit Complete: In person  Patient Medicare AWV questionnaire was completed by the patient on 08/15/2023; I have confirmed that all information answered by patient is correct and no changes since this date.  Cardiac Risk Factors include: dyslipidemia;advanced age (>102men, >74 women)    Objective:    Today's Vitals   08/16/23 0951  BP: 122/68  Weight: 156 lb (70.8 kg)  Height: 5\' 4"  (1.626 m)   Body mass index is 26.78 kg/m.     08/16/2023   10:01 AM 08/12/2022   10:06 AM 10/15/2021    6:48 AM 12/31/2020   12:07 PM 04/17/2020   10:28 AM 04/12/2019    8:57 AM 04/05/2018    3:20 PM  Advanced Directives  Does Patient Have a Medical Advance Directive? Yes No Yes Yes Yes Yes Yes  Type of Estate agent of [place];Living will  Living will Healthcare Power of [place];Living will Healthcare Power of [place];Living will Healthcare Power of [place];Living will Healthcare Power of [place];Living will  Does patient want to make changes to medical advance directive?    No - Patient declined     Copy of Healthcare Power of Attorney in Chart? No - copy requested   No - copy requested No - copy requested No - copy requested No - copy requested  Would patient like information on creating a medical advance directive?  No - Patient declined         Current Medications (verified) Outpatient Encounter Medications as of 08/16/2023  Medication Sig   alendronate (FOSAMAX) 70 MG tablet Take 1 tablet (70 mg total) by mouth every 7 (seven) days. Take with a full glass of water on an empty stomach.   aspirin 81 MG tablet Take 81 mg by mouth daily.   Calcium Carbonate-Vitamin D 600-400 MG-UNIT tablet Take 1 tablet by mouth daily.   cholecalciferol (VITAMIN D3) 25 MCG (1000 UNIT) tablet Take 1,000 Units by mouth daily.   Coenzyme Q10 (COQ10 PO) Take 1 capsule by  mouth daily.   lovastatin (MEVACOR) 20 MG tablet TAKE 1 TABLET BY MOUTH EVERY DAY   Magnesium 250 MG TABS Take 250 mg by mouth daily.   metoprolol succinate (TOPROL-XL) 25 MG 24 hr tablet TAKE 1/2 TABLET BY MOUTH EVERY DAY   MULTIPLE VITAMIN PO Take 1 tablet by mouth daily.   No facility-administered encounter medications on file as of 08/16/2023.    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   Past Surgical History:  Procedure Laterality Date   COLONOSCOPY  2005   COLONOSCOPY WITH PROPOFOL N/A 10/15/2021   Procedure: COLONOSCOPY WITH PROPOFOL;  Surgeon: Toney Reil, MD;  Location: [facility] ENDOSCOPY;  Service: Gastroenterology;  Laterality: N/A;   EYE SURGERY  not sure   HERNIA REPAIR     MOUTH SURGERY  1980's   Dental work   RETINAL LASER PROCEDURE Right    torn retina   XI ROBOTIC ASSISTED INGUINAL HERNIA REPAIR WITH MESH Bilateral 01/07/2021   Procedure: XI ROBOTIC ASSISTED INGUINAL HERNIA REPAIR WITH MESH;  Surgeon: Henrene Dodge, MD;  Location: [facility] ORS;  Service:  General;  Laterality: Bilateral;   Family History  Problem Relation Age of Onset   Hypertension Mother    Breast cancer Neg Hx    Social History   Socioeconomic History   Marital status: Married    Spouse name: Brett Canales   Number of children: 3   Years of education: Not on file   Highest education level: Some college, no degree  Occupational History   Occupation: retired  Tobacco Use   Smoking status: Never   Smokeless tobacco: Never  Vaping Use   Vaping status: Never Used  Substance and Sexual Activity   Alcohol use: No   Drug use: No   Sexual activity: Not on file  Other Topics Concern   Not on file  Social History Narrative   Not  on file   Social Determinants of Health   Financial Resource Strain: Low Risk  (08/15/2023)   Overall Financial Resource Strain (CARDIA)    Difficulty of Paying Living Expenses: Not hard at all  Food Insecurity: No Food Insecurity (08/15/2023)   Hunger Vital Sign    Worried About Running Out of Food in the Last Year: Never true    Ran Out of Food in the Last Year: Never true  Transportation Needs: No Transportation Needs (08/15/2023)   PRAPARE - Administrator, Civil Service (Medical): No    Lack of Transportation (Non-Medical): No  Physical Activity: Insufficiently Active (08/15/2023)   Exercise Vital Sign    Days of Exercise per Week: 3 days    Minutes of Exercise per Session: 20 min  Stress: No Stress Concern Present (08/15/2023)   [employer] of Occupational Health - Occupational Stress Questionnaire    Feeling of Stress : Not at all  Social Connections: Unknown (08/15/2023)   Social Connection and Isolation Panel [NHANES]    Frequency of Communication with Friends and Family: Three times a week    Frequency of Social Gatherings with Friends and Family: Three times a week    Attends Religious Services: Not on file    Active Member of Clubs or Organizations: Yes    Attends Banker Meetings: More than 4 times per year    Marital Status: Married    Tobacco Counseling Counseling given: Not Answered   Clinical Intake:  Pre-visit preparation completed: No  Pain : No/denies pain     BMI - recorded: 26.78 Nutritional Status: BMI 25 -29 Overweight Nutritional Risks: None Diabetes: No  How often do you need to have someone help you when you read instructions, pamphlets, or other written materials from your doctor or pharmacy?: 1 - Never  Interpreter Needed?: No  Comments: lives with husband Information entered by :: B.Rhylei Mcquaig,LPN   Activities of Daily Living    08/15/2023    7:54 PM 03/10/2023    9:48 AM  In your present state of  health, do you have any difficulty performing the following activities:  Hearing? 0 0  Vision? 0 0  Difficulty concentrating or making decisions? 0 0  Walking or climbing stairs? 0 0  Dressing or bathing? 0 0  Doing errands, shopping? 0 0  Preparing Food and eating ? N   Using the Toilet? N   In the past six months, have you accidently leaked urine? N   Do you have problems with loss of bowel control? N   Managing your Medications? N   Managing your Finances? N   Housekeeping or managing your Housekeeping? N     Patient  Care Team: Malva Limes, MD as PCP - General (Family Medicine) Lady Gary Darlin Priestly, MD as Consulting Physician (Cardiology) Pa, [facility]) Allegra Lai, Loel Dubonnet, MD as Consulting Physician (Gastroenterology)  Indicate any recent Medical Services you may have received from other than [facility] providers in the past year (date may be approximate).     Assessment:   This is a routine wellness examination for [place].  Hearing/Vision screen Hearing Screening - Comments:: Pt says her hearing is good Vision Screening - Comments:: Pt says her vision is good but some shadows [facility]   Goals Addressed             This Visit's Progress    COMPLETED: DIET - EAT MORE FRUITS AND VEGETABLES   On track    COMPLETED: Exercise 3x per week (30 min per time)   On track    Recommend to start exercising 3 days a week for at least 30 minutes at a time.       Depression Screen    08/16/2023    9:56 AM 03/10/2023    9:48 AM 09/07/2022    2:18 PM 08/12/2022   10:04 AM 08/11/2021    9:09 AM 01/01/2021    8:20 AM 04/17/2020   10:25 AM  PHQ 2/9 Scores  PHQ - 2 Score 0 0 0 0 0 0 0  PHQ- 9 Score  1 2 0 1 1     Fall Risk    08/15/2023    7:54 PM 03/10/2023    9:47 AM 09/07/2022    2:18 PM 08/12/2022   10:07 AM 08/10/2022    7:51 AM  Fall Risk   Falls in the past year? 0 1 1 1 1   Number falls in past yr: 0 1 0 0 0  Injury with Fall? 0 0 0 0 0  Risk for  fall due to : No Fall Risks  History of fall(s) History of fall(s)   Follow up Falls prevention discussed;Education provided  Falls evaluation completed Falls evaluation completed;Falls prevention discussed     MEDICARE RISK AT HOME: Medicare Risk at Home Any stairs in or around the home?: No Home free of loose throw rugs in walkways, pet beds, electrical cords, etc?: Yes Adequate lighting in your home to reduce risk of falls?: Yes Life alert?: No Use of a cane, walker or w/c?: No Grab bars in the bathroom?: No Shower chair or bench in shower?: No Elevated toilet seat or a handicapped toilet?: No  TIMED UP AND GO:  Was the test performed?  Yes  Length of time to ambulate 10 feet: 8 sec Gait steady and fast without use of assistive device    Cognitive Function:        08/16/2023   10:04 AM 08/12/2022   10:11 AM 04/17/2020   10:35 AM 04/12/2019    9:01 AM 04/05/2018    3:24 PM  6CIT Screen  What Year? 0 points 0 points 0 points 0 points 0 points  What month? 0 points 0 points 0 points 0 points 0 points  What time? 0 points 0 points 0 points 0 points 0 points  Count back from 20 0 points 0 points 0 points 0 points 0 points  Months in reverse 0 points 0 points 0 points 0 points 0 points  Repeat phrase 0 points 0 points 0 points 0 points 0 points  Total Score 0 points 0 points 0 points 0 points 0 points  Immunizations Immunization History  Administered Date(s) Administered   Fluad Quad(high Dose 65+) 06/28/2019, 07/16/2020, 08/11/2021, 09/07/2022   Fluad Trivalent(High Dose 65+) 08/16/2023   Influenza Split 07/19/2010   Influenza,inj,Quad PF,6+ Mos 07/31/2013   Influenza-Unspecified 09/04/2015, 08/16/2017, 09/01/2018   PFIZER Comirnaty(Gray Top)Covid-19 Tri-Sucrose Vaccine 03/18/2021   PFIZER(Purple Top)SARS-COV-2 Vaccination 12/29/2019, 01/23/2020   Pneumococcal Conjugate-13 11/22/2015   Pneumococcal Polysaccharide-23 03/03/2017   Td 08/06/2004, 10/12/2014   Tdap  10/12/2014    TDAP status: Up to date  Flu Vaccine status: Completed at today's visit  Pneumococcal vaccine status: Up to date  Covid-19 vaccine status: Completed vaccines  Qualifies for Shingles Vaccine? Yes   Zostavax completed No   Shingrix Completed?: No.    Education has been provided regarding the importance of this vaccine. Patient has been advised to call insurance company to determine out of pocket expense if they have not yet received this vaccine. Advised may also receive vaccine at local pharmacy or Health Dept. Verbalized acceptance and understanding.  Screening Tests Health Maintenance  Topic Date Due   COVID-19 Vaccine (4 - 2023-24 season) 09/01/2023 (Originally 07/04/2023)   Zoster Vaccines- Shingrix (1 of 2) 11/16/2023 (Originally 06/22/2000)   DEXA SCAN  09/04/2023   MAMMOGRAM  10/08/2023   Medicare Annual Wellness (AWV)  08/15/2024   DTaP/Tdap/Td (4 - Td or Tdap) 10/12/2024   Colonoscopy  10/15/2024   Pneumonia Vaccine 35+ Years old  Completed   INFLUENZA VACCINE  Completed   Hepatitis C Screening  Completed   HPV VACCINES  Aged Out   Fecal DNA (Cologuard)  Discontinued    Health Maintenance  There are no preventive care reminders to display for this patient.   Colorectal cancer screening: Type of screening: Colonoscopy. Completed 10/15/21. Repeat every 3 years  Mammogram status: Completed 10/07/22. Repeat every year  Bone Density status: Completed 09/03/2021. Results reflect: Bone density results: OSTEOPOROSIS. Repeat every 3-5 years.  Lung Cancer Screening: (Low Dose CT Chest recommended if Age 81-80 years, 20 pack-year currently smoking OR have quit w/in 15years.) does not qualify.   Lung Cancer Screening Referral: no  Additional Screening:  Hepatitis C Screening: does not qualify; Completed 06/08/12  Vision Screening: Recommended annual ophthalmology exams for early detection of glaucoma and other disorders of the eye. Is the patient up to date with  their annual eye exam?  Yes  Who is the provider or what is the name of the office in which the patient attends annual eye exams? [facility] If pt is not established with a provider, would they like to be referred to a provider to establish care? No .   Dental Screening: Recommended annual dental exams for proper oral hygiene  Diabetic Foot Exam: n/a  Community Resource Referral / Chronic Care Management: CRR required this visit?  No   CCM required this visit?  No    Plan:     I have personally reviewed and noted the following in the patient's chart:   Medical and social history Use of alcohol, tobacco or illicit drugs  Current medications and supplements including opioid prescriptions. Patient is not currently taking opioid prescriptions. Functional ability and status Nutritional status Physical activity Advanced directives List of other physicians Hospitalizations, surgeries, and ER visits in previous 12 months Vitals Screenings to include cognitive, depression, and falls Referrals and appointments  In addition, I have reviewed and discussed with patient certain preventive protocols, quality metrics, and best practice recommendations. A written personalized care plan for preventive services as well as general  preventive health recommendations were provided to patient.    Sue Lush, LPN   [id number]   After Visit Summary: Pt desires to see in MyChart:declined printed version  Nurse Notes: The patient states she is doing well and has no concerns or questions at this time.  *Influenza vaccine given today

## 2023-08-16 NOTE — Patient Instructions (Signed)
Ms. Slawson , Thank you for taking time to come for your Medicare Wellness Visit. I appreciate your ongoing commitment to your health goals. Please review the following plan we discussed and let me know if I can assist you in the future.   Referrals/Orders/Follow-Ups/Clinician Recommendations: none  This is a list of the screening recommended for you and due dates:  Health Maintenance  Topic Date Due   COVID-19 Vaccine (4 - 2023-24 season) 09/01/2023*   Zoster (Shingles) Vaccine (1 of 2) 11/16/2023*   DEXA scan (bone density measurement)  09/04/2023   Mammogram  10/08/2023   Medicare Annual Wellness Visit  08/15/2024   DTaP/Tdap/Td vaccine (4 - Td or Tdap) 10/12/2024   Colon Cancer Screening  10/15/2024   Pneumonia Vaccine  Completed   Flu Shot  Completed   Hepatitis C Screening  Completed   HPV Vaccine  Aged Out   Cologuard (Stool DNA test)  Discontinued  *Topic was postponed. The date shown is not the original due date.    Advanced directives: (Copy Requested) Please bring a copy of your health care power of attorney and living will to the office to be added to your chart at your convenience.  Next Medicare Annual Wellness Visit scheduled for next year: Yes 10/ 20/25 @ 10:05am in person

## 2023-09-03 ENCOUNTER — Other Ambulatory Visit: Payer: Self-pay | Admitting: Family Medicine

## 2023-09-03 DIAGNOSIS — M810 Age-related osteoporosis without current pathological fracture: Secondary | ICD-10-CM

## 2023-09-20 ENCOUNTER — Encounter: Payer: Self-pay | Admitting: Family Medicine

## 2023-09-20 ENCOUNTER — Other Ambulatory Visit: Payer: Self-pay | Admitting: Family Medicine

## 2023-09-20 ENCOUNTER — Ambulatory Visit (INDEPENDENT_AMBULATORY_CARE_PROVIDER_SITE_OTHER): Payer: Medicare Other | Admitting: Family Medicine

## 2023-09-20 VITALS — BP 140/80 | HR 80 | Temp 97.9°F | Resp 16 | Ht 64.0 in | Wt 155.8 lb

## 2023-09-20 DIAGNOSIS — Z0000 Encounter for general adult medical examination without abnormal findings: Secondary | ICD-10-CM | POA: Diagnosis not present

## 2023-09-20 DIAGNOSIS — M810 Age-related osteoporosis without current pathological fracture: Secondary | ICD-10-CM

## 2023-09-20 DIAGNOSIS — R002 Palpitations: Secondary | ICD-10-CM

## 2023-09-20 DIAGNOSIS — E559 Vitamin D deficiency, unspecified: Secondary | ICD-10-CM | POA: Diagnosis not present

## 2023-09-20 DIAGNOSIS — R7303 Prediabetes: Secondary | ICD-10-CM

## 2023-09-20 DIAGNOSIS — Z860101 Personal history of adenomatous and serrated colon polyps: Secondary | ICD-10-CM

## 2023-09-20 DIAGNOSIS — E7800 Pure hypercholesterolemia, unspecified: Secondary | ICD-10-CM

## 2023-09-20 DIAGNOSIS — Z1231 Encounter for screening mammogram for malignant neoplasm of breast: Secondary | ICD-10-CM

## 2023-09-20 MED ORDER — ALENDRONATE SODIUM 70 MG PO TABS: 70.0000 mg | ORAL_TABLET | ORAL | 3 refills | Status: DC

## 2023-09-20 NOTE — Progress Notes (Signed)
Established patient visit   Patient: Caitlyn Baker   DOB: 1951/01/19   72 y.o. Female  MRN: [id number] Visit Date: 09/20/2023  Today's healthcare provider: Mila Merry, MD   Chief Complaint  Patient presents with   Medical Management of Chronic Issues   Subjective    Discussed the use of AI scribe software for clinical note transcription with the patient, who gave verbal consent to proceed.  History of Present Illness   The patient presents for routine follow up of hyperlipidemia, vitamin D deficiency, heart palpitations and osteoporosis. The heart palpitations are managed with metoprolol, reports significant improvement in symptoms, experiencing only one to two episodes per month. She denies any associated chest pain or dyspnea. The patient's blood pressure has been generally well-controlled, with occasional slight elevations. She denies any side effects from the Fosamax or vitamin D. She is taking lovastatin consistently for her cholesterol and tolerating well  The patient also reports occasional balance issues, needing to sit on the side of the bed for about five minutes before getting up in the morning. She experienced a recent episode of feeling "teeter-totter," but these episodes are infrequent. The patient has a history of a torn retina, managed with laser surgery, and continues to have annual eye exams.  The patient is due for routine blood work, including glucose and cholesterol levels, and has fasted in preparation for these tests. She is also due for a mammogram, which she plans to schedule.       Medications: Outpatient Medications Prior to Visit  Medication Sig   aspirin 81 MG tablet Take 81 mg by mouth daily.   Calcium Carbonate-Vitamin D 600-400 MG-UNIT tablet Take 1 tablet by mouth daily.   cholecalciferol (VITAMIN D3) 25 MCG (1000 UNIT) tablet Take 1,000 Units by mouth daily.   Coenzyme Q10 (COQ10 PO) Take 1 capsule by mouth daily.   lovastatin (MEVACOR) 20 MG  tablet TAKE 1 TABLET BY MOUTH EVERY DAY   Magnesium 250 MG TABS Take 250 mg by mouth daily.   metoprolol succinate (TOPROL-XL) 25 MG 24 hr tablet TAKE 1/2 TABLET BY MOUTH EVERY DAY   MULTIPLE VITAMIN PO Take 1 tablet by mouth daily.   [DISCONTINUED] alendronate (FOSAMAX) 70 MG tablet Take 1 tablet (70 mg total) by mouth every 7 (seven) days. Take with a full glass of water on an empty stomach.   No facility-administered medications prior to visit.   Review of Systems     Objective    BP (!) 140/80 (BP Location: Left Arm, Patient Position: Sitting, Cuff Size: Normal)   Pulse 80   Temp 97.9 F (36.6 C) (Oral)   Resp 16   Ht 5\' 4"  (1.626 m)   Wt 155 lb 12.8 oz (70.7 kg)   BMI 26.74 kg/m   Physical Exam   HEENT: Nose and throat unremarkable. Ears unremarkable. CARDIOVASCULAR: Heart and lung sounds normal. NEUROLOGICAL: Extraocular movements intact. Coordination and proprioception intact.    No results found for any visits on 09/20/23.  Assessment & Plan        Palpitations Infrequent episodes, well controlled on Metoprolol. No associated chest pain or dyspnea. -Continue Metoprolol.  Osteoporosis On Fosamax, due for bone density test. No reported side effects. -Refill Fosamax for 6 months. -Schedule bone density test. If results are satisfactory, consider a medication holiday after the 63-month supply is finished.   Hyperlipidemia -check lipids today   Prediabetes -check A1c  General Health Maintenance -Order labs to check  glucose and cholesterol levels. -Schedule mammogram. -Continue regular eye exams at [facility] due to history of laser surgery for torn retina.    No follow-ups on file.      Mila Merry, MD  [facility] [phone] (phone) [phone] (fax)  [facility]

## 2023-09-21 LAB — RENAL FUNCTION PANEL
Albumin: 4.2 g/dL (ref 3.8–4.8)
BUN/Creatinine Ratio: 18 (ref 12–28)
BUN: 13 mg/dL (ref 8–27)
CO2: 24 mmol/L (ref 20–29)
Calcium: 10.4 mg/dL — ABNORMAL HIGH (ref 8.7–10.3)
Chloride: 107 mmol/L — ABNORMAL HIGH (ref 96–106)
Creatinine, Ser: 0.74 mg/dL (ref 0.57–1.00)
Glucose: 100 mg/dL — ABNORMAL HIGH (ref 70–99)
Phosphorus: 4.9 mg/dL — ABNORMAL HIGH (ref 3.0–4.3)
Potassium: 4.7 mmol/L (ref 3.5–5.2)
Sodium: 145 mmol/L — ABNORMAL HIGH (ref 134–144)
eGFR: 85 mL/min/{1.73_m2} (ref >59)

## 2023-09-21 LAB — HEMOGLOBIN A1C
Est. average glucose Bld gHb Est-mCnc: 131 mg/dL
Hgb A1c MFr Bld: 6.2 % — ABNORMAL HIGH (ref 4.8–5.6)

## 2023-09-21 LAB — LIPID PANEL
Chol/HDL Ratio: 4 ratio (ref 0.0–4.4)
Cholesterol, Total: 213 mg/dL — ABNORMAL HIGH (ref 100–199)
HDL: 53 mg/dL (ref >39)
LDL Chol Calc (NIH): 130 mg/dL — ABNORMAL HIGH (ref 0–99)
Triglycerides: 167 mg/dL — ABNORMAL HIGH (ref 0–149)
VLDL Cholesterol Cal: 30 mg/dL (ref 5–40)

## 2023-09-21 LAB — HEPATIC FUNCTION PANEL
ALT: 15 [IU]/L (ref 0–32)
AST: 16 [IU]/L (ref 0–40)
Alkaline Phosphatase: 80 [IU]/L (ref 44–121)
Bilirubin Total: 0.4 mg/dL (ref 0.0–1.2)
Bilirubin, Direct: 0.14 mg/dL (ref 0.00–0.40)
Total Protein: 6.6 g/dL (ref 6.0–8.5)

## 2023-09-21 LAB — VITAMIN D 25 HYDROXY (VIT D DEFICIENCY, FRACTURES): Vit D, 25-Hydroxy: 34.6 ng/mL (ref 30.0–100.0)

## 2023-11-12 ENCOUNTER — Ambulatory Visit
Admission: RE | Admit: 2023-11-12 | Discharge: 2023-11-12 | Disposition: A | Discharge status: Home / Self Care | Payer: Medicare Other | Source: Ambulatory Visit | Attending: Family Medicine | Admitting: Family Medicine

## 2023-11-12 DIAGNOSIS — Z780 Asymptomatic menopausal state: Secondary | ICD-10-CM | POA: Diagnosis not present

## 2023-11-12 DIAGNOSIS — M810 Age-related osteoporosis without current pathological fracture: Secondary | ICD-10-CM

## 2023-11-12 DIAGNOSIS — Z1231 Encounter for screening mammogram for malignant neoplasm of breast: Secondary | ICD-10-CM | POA: Diagnosis not present

## 2023-11-14 ENCOUNTER — Other Ambulatory Visit: Payer: Self-pay | Admitting: Family Medicine

## 2023-11-14 DIAGNOSIS — M810 Age-related osteoporosis without current pathological fracture: Secondary | ICD-10-CM

## 2024-01-28 DIAGNOSIS — Z9889 Other specified postprocedural states: Secondary | ICD-10-CM | POA: Diagnosis not present

## 2024-01-28 DIAGNOSIS — H2513 Age-related nuclear cataract, bilateral: Secondary | ICD-10-CM | POA: Diagnosis not present

## 2024-02-02 ENCOUNTER — Other Ambulatory Visit: Payer: Self-pay | Admitting: Family Medicine

## 2024-02-02 DIAGNOSIS — E7800 Pure hypercholesterolemia, unspecified: Secondary | ICD-10-CM

## 2024-06-01 DIAGNOSIS — E7800 Pure hypercholesterolemia, unspecified: Secondary | ICD-10-CM | POA: Diagnosis not present

## 2024-06-01 DIAGNOSIS — R002 Palpitations: Secondary | ICD-10-CM | POA: Diagnosis not present

## 2024-06-01 DIAGNOSIS — I493 Ventricular premature depolarization: Secondary | ICD-10-CM | POA: Diagnosis not present

## 2024-07-28 ENCOUNTER — Other Ambulatory Visit: Payer: Self-pay | Admitting: Family Medicine

## 2024-07-28 DIAGNOSIS — R002 Palpitations: Secondary | ICD-10-CM

## 2024-08-29 ENCOUNTER — Ambulatory Visit: Payer: Medicare Other

## 2024-08-29 VITALS — BP 132/64 | Ht 64.0 in | Wt 156.6 lb

## 2024-08-29 DIAGNOSIS — Z23 Encounter for immunization: Secondary | ICD-10-CM

## 2024-08-29 DIAGNOSIS — Z0000 Encounter for general adult medical examination without abnormal findings: Secondary | ICD-10-CM | POA: Diagnosis not present

## 2024-08-29 DIAGNOSIS — Z1231 Encounter for screening mammogram for malignant neoplasm of breast: Secondary | ICD-10-CM

## 2024-08-29 NOTE — Progress Notes (Signed)
 Subjective:   ARAYNA ILLESCAS is a 73 y.o. who presents for a Medicare Wellness preventive visit.  As a reminder, Annual Wellness Visits don't include a physical exam, and some assessments may be limited, especially if this visit is performed virtually. We may recommend an in-person follow-up visit with your provider if needed.  Visit Complete: In person  Persons Participating in Visit: Patient.  AWV Questionnaire: No: Patient Medicare AWV questionnaire was not completed prior to this visit.  Cardiac Risk Factors include: advanced age (>33men, >72 women);dyslipidemia     Objective:    Today's Vitals   08/29/24 1348  BP: 132/64  Weight: 156 lb 9.6 oz (71 kg)  Height: 5' 4 (1.626 m)   Body mass index is 26.88 kg/m.     08/29/2024    1:56 PM 08/16/2023   10:01 AM 08/12/2022   10:06 AM 10/15/2021    6:48 AM 12/31/2020   12:07 PM 04/17/2020   10:28 AM 04/12/2019    8:57 AM  Advanced Directives  Does Patient Have a Medical Advance Directive? No Yes No Yes Yes Yes Yes  Type of Furniture Conservator/restorer;Living will  Living will Healthcare Power of [place];Living will Healthcare Power of [place];Living will Healthcare Power of [place];Living will  Does patient want to make changes to medical advance directive?     No - Patient declined    Copy of Healthcare Power of Attorney in Chart?  No - copy requested   No - copy requested No - copy requested No - copy requested   Would patient like information on creating a medical advance directive? No - Patient declined  No - Patient declined         Data saved with a previous flowsheet row definition    Current Medications (verified) Outpatient Encounter Medications as of 08/29/2024  Medication Sig   aspirin 81 MG tablet Take 81 mg by mouth daily.   cholecalciferol (VITAMIN D3) 25 MCG (1000 UNIT) tablet Take 1,000 Units by mouth daily.   Coenzyme Q10 (COQ10 PO) Take 1 capsule by mouth daily.   lovastatin  (MEVACOR) 20 MG tablet TAKE 1 TABLET BY MOUTH EVERY DAY   Magnesium  250 MG TABS Take 250 mg by mouth daily.   metoprolol  succinate (TOPROL -XL) 25 MG 24 hr tablet TAKE 1/2 TABLET BY MOUTH EVERY DAY   MULTIPLE VITAMIN PO Take 1 tablet by mouth daily.   Calcium Carbonate-Vitamin D  600-400 MG-UNIT tablet Take 1 tablet by mouth daily. (Patient not taking: Reported on 08/29/2024)   No facility-administered encounter medications on file as of 08/29/2024.    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   Past Surgical History:  Procedure Laterality Date   COLONOSCOPY  2005   COLONOSCOPY WITH PROPOFOL  N/A 10/15/2021   Procedure: COLONOSCOPY WITH PROPOFOL ;  Surgeon: Unk Corinn Skiff, MD;  Location: [facility] ENDOSCOPY;  Service: Gastroenterology;  Laterality: N/A;   EYE SURGERY  not sure   HERNIA REPAIR     MOUTH SURGERY  1980's   Dental work   RETINAL LASER PROCEDURE Right    torn retina   XI ROBOTIC ASSISTED INGUINAL HERNIA REPAIR WITH MESH Bilateral 01/07/2021   Procedure: XI ROBOTIC ASSISTED  INGUINAL HERNIA REPAIR WITH MESH;  Surgeon: Desiderio Schanz, MD;  Location: [facility] ORS;  Service: General;  Laterality: Bilateral;   Family History  Problem Relation Age of Onset   Hypertension Mother    Breast cancer Neg Hx    Social History   Socioeconomic History   Marital status: Married    Spouse name: Marcey   Number of children: 3   Years of education: Not on file   Highest education level: 12th grade  Occupational History   Occupation: retired  Tobacco Use   Smoking status: Never   Smokeless tobacco: Never  Vaping Use   Vaping status: Never Used  Substance and Sexual Activity   Alcohol use: No   Drug use: No   Sexual  activity: Not on file  Other Topics Concern   Not on file  Social History Narrative   Not on file   Social Drivers of Health   Financial Resource Strain: Low Risk  (08/29/2024)   Overall Financial Resource Strain (CARDIA)    Difficulty of Paying Living Expenses: Not hard at all  Food Insecurity: No Food Insecurity (08/29/2024)   Hunger Vital Sign    Worried About Running Out of Food in the Last Year: Never true    Ran Out of Food in the Last Year: Never true  Transportation Needs: No Transportation Needs (08/29/2024)   PRAPARE - Administrator, Civil Service (Medical): No    Lack of Transportation (Non-Medical): No  Physical Activity: Insufficiently Active (08/29/2024)   Exercise Vital Sign    Days of Exercise per Week: 2 days    Minutes of Exercise per Session: 20 min  Stress: No Stress Concern Present (08/29/2024)   [employer] of Occupational Health - Occupational Stress Questionnaire    Feeling of Stress: Not at all  Social Connections: Socially Integrated (08/29/2024)   Social Connection and Isolation Panel    Frequency of Communication with Friends and Family: More than three times a week    Frequency of Social Gatherings with Friends and Family: Three times a week    Attends Religious Services: More than 4 times per year    Active Member of Clubs or Organizations: Yes    Attends Engineer, Structural: More than 4 times per year    Marital Status: Married    Tobacco Counseling Counseling given: Not Answered    Clinical Intake:  Pre-visit preparation completed: Yes  Pain : No/denies pain     BMI - recorded: 26.88 Nutritional Status: BMI 25 -29 Overweight Nutritional Risks: None Diabetes: No  Lab Results  Component Value Date   HGBA1C 6.2 (H) 09/20/2023   HGBA1C 6.0 (H) 03/10/2023   HGBA1C 6.0 (H) 07/17/2020     How often do you need to have someone help you when you read instructions, pamphlets, or other written materials from  your doctor or pharmacy?: 1 - Never  Interpreter Needed?: No  Information entered by :: JHONNIE DAS, LPN   Activities of Daily Living     08/29/2024    1:57 PM 08/28/2024    7:05 PM  In your present state of health, do you have any difficulty performing the following activities:  Hearing? 0 0  Vision? 0 0  Difficulty concentrating or making decisions? 0 0  Walking or climbing stairs? 0 0  Dressing or bathing? 0 0  Doing errands, shopping? 0 0  Preparing Food and eating ? N N  Using the Toilet? N N  In the  past six months, have you accidently leaked urine? N N  Do you have problems with loss of bowel control? N N  Managing your Medications? N N  Managing your Finances? N N  Housekeeping or managing your Housekeeping? N N    Patient Care Team: Gasper Nancyann BRAVO, MD as PCP - General (Family Medicine) Bosie Vinie LABOR, MD as Consulting Physician (Cardiology) [facility], [facility]) Unk, Corinn Skiff, MD as Consulting Physician (Gastroenterology)  I have updated your Care Teams any recent Medical Services you may have received from other providers in the past year.     Assessment:   This is a routine wellness examination for [place].  Hearing/Vision screen Hearing Screening - Comments:: NO AIDS Vision Screening - Comments:: READERS- [facility]- SEEN ONCE PER YEAR   Goals Addressed             This Visit's Progress    DIET - REDUCE SALT INTAKE TO 2 GRAMS PER DAY OR LESS         Depression Screen     08/29/2024    1:55 PM 08/16/2023    9:56 AM 03/10/2023    9:48 AM 09/07/2022    2:18 PM 08/12/2022   10:04 AM 08/11/2021    9:09 AM 01/01/2021    8:20 AM  PHQ 2/9 Scores  PHQ - 2 Score 0 0 0 0 0 0 0  PHQ- 9 Score 0  1 2 0 1 1    Fall Risk     08/28/2024    7:05 PM 08/22/2024    9:27 AM 08/15/2023    7:54 PM 03/10/2023    9:47 AM 09/07/2022    2:18 PM  Fall Risk   Falls in the past year? 0 0 0 1 1  Number falls in past yr: 0 0 0 1 0  Injury  with Fall? 0 0 0 0 0  Risk for fall due to : No Fall Risks  No Fall Risks  History of fall(s)  Follow up Falls evaluation completed;Falls prevention discussed  Falls prevention discussed;Education provided  Falls evaluation completed      Data saved with a previous flowsheet row definition    MEDICARE RISK AT HOME:  Medicare Risk at Home Any stairs in or around the home?: Yes If so, are there any without handrails?: No Home free of loose throw rugs in walkways, pet beds, electrical cords, etc?: Yes Adequate lighting in your home to reduce risk of falls?: Yes Life alert?: No Use of a cane, walker or w/c?: No Grab bars in the bathroom?: No Shower chair or bench in shower?: No Elevated toilet seat or a handicapped toilet?: No  TIMED UP AND GO:  Was the test performed?  Yes  Length of time to ambulate 10 feet: 4 sec Gait steady and fast without use of assistive device  Cognitive Function: 6CIT completed        08/29/2024    1:59 PM 08/16/2023   10:04 AM 08/12/2022   10:11 AM 04/17/2020   10:35 AM 04/12/2019    9:01 AM  6CIT Screen  What Year?  0 points 0 points 0 points 0 points  What month?  0 points 0 points 0 points 0 points  What time? 0 points 0 points 0 points 0 points 0 points  Count back from 20 0 points 0 points 0 points 0 points 0 points  Months in reverse 0 points 0 points 0 points 0 points 0 points  Repeat phrase 0 points 0 points 0 points 0 points 0 points  Total Score  0 points 0 points 0 points 0 points    Immunizations Immunization History  Administered Date(s) Administered   Fluad Quad(high Dose 65+) 06/28/2019, 07/16/2020, 08/11/2021, 09/07/2022   Fluad Trivalent(High Dose 65+) 08/16/2023   Influenza Split 07/19/2010   Influenza,inj,Quad PF,6+ Mos 07/31/2013   Influenza-Unspecified 09/04/2015, 08/16/2017, 09/01/2018   PFIZER Comirnaty(Gray Top)Covid-19 Tri-Sucrose Vaccine 03/18/2021   PFIZER(Purple Top)SARS-COV-2 Vaccination 12/29/2019, 01/23/2020    Pneumococcal Conjugate-13 11/22/2015   Pneumococcal Polysaccharide-23 03/03/2017   Td 08/06/2004, 10/12/2014   Tdap 10/12/2014    Screening Tests Health Maintenance  Topic Date Due   Zoster Vaccines- Shingrix  (1 of 2) Never done   Influenza Vaccine  06/02/2024   COVID-19 Vaccine (4 - 2025-26 season) 07/03/2024   Colonoscopy  10/15/2024   DTaP/Tdap/Td (4 - Td or Tdap) 10/12/2024   Mammogram  11/11/2024   Medicare Annual Wellness (AWV)  08/29/2025   DEXA SCAN  11/11/2025   Pneumococcal Vaccine: 50+ Years  Completed   Hepatitis C Screening  Completed   Meningococcal B Vaccine  Aged Out   Fecal DNA (Cologuard)  Discontinued    Health Maintenance Items Addressed: NEEDS SHINGRIX ; FLU SHOT GIVEN TODAY; MAMMOGRAM ORDERED; UTD ON BDS  Additional Screening:  Vision Screening: Recommended annual ophthalmology exams for early detection of glaucoma and other disorders of the eye. Is the patient up to date with their annual eye exam?  Yes  Who is the provider or what is the name of the office in which the patient attends annual eye exams? [facility]  Dental Screening: Recommended annual dental exams for proper oral hygiene  Community Resource Referral / Chronic Care Management: CRR required this visit?  No   CCM required this visit?  No   Plan:    I have personally reviewed and noted the following in the patient's chart:   Medical and social history Use of alcohol, tobacco or illicit drugs  Current medications and supplements including opioid prescriptions. Patient is not currently taking opioid prescriptions. Functional ability and status Nutritional status Physical activity Advanced directives List of other physicians Hospitalizations, surgeries, and ER visits in previous 12 months Vitals Screenings to include cognitive, depression, and falls Referrals and appointments  In addition, I have reviewed and discussed with patient certain preventive protocols, quality  metrics, and best practice recommendations. A written personalized care plan for preventive services as well as general preventive health recommendations were provided to patient.   Jhonnie GORMAN Das, LPN   [id number]   After Visit Summary: (In Person-Declined) Patient declined AVS at this time.  Notes: MAMMOGRAM REFERRAL SENT  FLU SHOT GIVEN

## 2024-08-29 NOTE — Patient Instructions (Addendum)
 Caitlyn Baker,  Thank you for taking the time for your Medicare Wellness Visit. I appreciate your continued commitment to your health goals. Please review the care plan we discussed, and feel free to reach out if I can assist you further.  Medicare recommends these wellness visits once per year to help you and your care team stay ahead of potential health issues. These visits are designed to focus on prevention, allowing your provider to concentrate on managing your acute and chronic conditions during your regular appointments.  Please note that Annual Wellness Visits do not include a physical exam. Some assessments may be limited, especially if the visit was conducted virtually. If needed, we may recommend a separate in-person follow-up with your provider.  Ongoing Care Seeing your primary care provider every 3 to 6 months helps us  monitor your health and provide consistent, personalized care.   Referrals If a referral was made during today's visit and you haven't received any updates within two weeks, please contact the referred provider directly to check on the status.  Recommended Screenings:  Health Maintenance  Topic Date Due   Zoster (Shingles) Vaccine (1 of 2) Never done   COVID-19 Vaccine (4 - 2025-26 season) 07/03/2024   Colon Cancer Screening  10/15/2024   DTaP/Tdap/Td vaccine (4 - Td or Tdap) 10/12/2024   Breast Cancer Screening  11/11/2024   Medicare Annual Wellness Visit  08/29/2025   DEXA scan (bone density measurement)  11/11/2025   Pneumococcal Vaccine for age over 86  Completed   Flu Shot  Completed   Hepatitis C Screening  Completed   Meningitis B Vaccine  Aged Out   Cologuard (Stool DNA test)  Discontinued     Advance Care Planning is important because it: Ensures you receive medical care that aligns with your values, goals, and preferences. Provides guidance to your family and loved ones, reducing the emotional burden of decision-making during critical  moments.  Vision: Annual vision screenings are recommended for early detection of glaucoma, cataracts, and diabetic retinopathy. These exams can also reveal signs of chronic conditions such as diabetes and high blood pressure.  Dental: Annual dental screenings help detect early signs of oral cancer, gum disease, and other conditions linked to overall health, including heart disease and diabetes.  Please see the attached documents for additional preventive care recommendations.   FLU SHOT GIVEN  NEXT AWV 09/04/25 @ 1:50 PM IN PERSON

## 2024-09-25 ENCOUNTER — Encounter: Payer: Self-pay | Admitting: Family Medicine

## 2024-09-25 ENCOUNTER — Ambulatory Visit: Admitting: Family Medicine

## 2024-09-25 VITALS — BP 138/68 | HR 83 | Resp 16 | Ht 64.0 in | Wt 155.7 lb

## 2024-09-25 DIAGNOSIS — M7552 Bursitis of left shoulder: Secondary | ICD-10-CM

## 2024-09-25 DIAGNOSIS — M810 Age-related osteoporosis without current pathological fracture: Secondary | ICD-10-CM

## 2024-09-25 DIAGNOSIS — E559 Vitamin D deficiency, unspecified: Secondary | ICD-10-CM | POA: Diagnosis not present

## 2024-09-25 DIAGNOSIS — I493 Ventricular premature depolarization: Secondary | ICD-10-CM

## 2024-09-25 DIAGNOSIS — R7303 Prediabetes: Secondary | ICD-10-CM | POA: Diagnosis not present

## 2024-09-25 DIAGNOSIS — Z860101 Personal history of adenomatous and serrated colon polyps: Secondary | ICD-10-CM

## 2024-09-25 DIAGNOSIS — E7800 Pure hypercholesterolemia, unspecified: Secondary | ICD-10-CM | POA: Diagnosis not present

## 2024-09-25 NOTE — Progress Notes (Signed)
 Established patient visit   Patient: Caitlyn Baker   DOB: 11/22/1950   73 y.o. Female  MRN: [id number] Visit Date: 09/25/2024  Today's healthcare provider: Nancyann Perry, MD   Chief Complaint  Patient presents with   Annual Exam    Patient here for physical. Patient had her AWV done 08/29/24.   Subjective    Discussed the use of AI scribe software for clinical note transcription with the patient, who gave verbal consent to proceed.  History of Present Illness   Caitlyn Baker is a 72 year old female who presents for routine follow up of chronic conditions.    she continues on lovastatin for hyperlipidemia which she tolerates without side effects. she continues on metoprolol  for history of PVCs which she tolerates well without side effects.   She has been experiencing shoulder pain over the past few weeks, particularly when raising her arm or putting on a sweater. The pain was more severe about a month ago but has since improved. The shoulder only hurts when moved and does not cause pain at rest.  She mentions occasional ringing in her ears, which is not severe, does not last long, and occurs infrequently.  She had a colonoscopy in 2022 where 2 serrated adenomatous polyps were removed. She was advised to have a follow-up in three years.  She had a torn retina in the past and underwent laser surgery, and she sees her eye doctor annually.  No stomach problems, cramps, or changes in bowel habits.     Lab Results  Component Value Date   CHOL 213 (H) 09/20/2023   HDL 53 09/20/2023   LDLCALC 130 (H) 09/20/2023   TRIG 167 (H) 09/20/2023   CHOLHDL 4.0 09/20/2023   Lab Results  Component Value Date   NA 145 (H) 09/20/2023   K 4.7 09/20/2023   CREATININE 0.74 09/20/2023   EGFR 85 09/20/2023   GLUCOSE 100 (H) 09/20/2023   Lab Results  Component Value Date   VD25OH 34.6 09/20/2023     Medications: Outpatient Medications Prior to Visit  Medication Sig   aspirin 81 MG  tablet Take 81 mg by mouth daily.   cholecalciferol (VITAMIN D3) 25 MCG (1000 UNIT) tablet Take 1,000 Units by mouth daily.   Coenzyme Q10 (COQ10 PO) Take 1 capsule by mouth daily.   lovastatin (MEVACOR) 20 MG tablet TAKE 1 TABLET BY MOUTH EVERY DAY   Magnesium  250 MG TABS Take 250 mg by mouth daily.   metoprolol  succinate (TOPROL -XL) 25 MG 24 hr tablet TAKE 1/2 TABLET BY MOUTH EVERY DAY   MULTIPLE VITAMIN PO Take 1 tablet by mouth daily.   Calcium Carbonate-Vitamin D  600-400 MG-UNIT tablet Take 1 tablet by mouth daily. (Patient not taking: Reported on 09/25/2024)   No facility-administered medications prior to visit.   Review of Systems     Objective    BP 138/68 (BP Location: Right Arm, Patient Position: Sitting, Cuff Size: Normal)   Pulse 83   Resp 16   Ht 5' 4 (1.626 m)   Wt 155 lb 11.2 oz (70.6 kg)   SpO2 97%   BMI 26.73 kg/m   Physical Exam   General: Appearance:    Well developed, well nourished female in no acute distress  Eyes:    PERRL, conjunctiva/corneas clear, EOM's intact       Lungs:     Clear to auscultation bilaterally, respirations unlabored  Heart:    Normal heart rate. Normal rhythm.  No murmurs, rubs, or gallops.    MS:   All extremities are intact.    Neurologic:   Awake, alert, oriented x 3. No apparent focal neurological defect.          Assessment & Plan    1. Hypercholesteremia (Primary) She is tolerating lovastatin well with no adverse effects.   - CBC - Comprehensive metabolic panel with GFR - Lipid panel  2. Vitamin D  deficiency  - VITAMIN D  25 Hydroxy (Vit-D Deficiency, Fractures)  3. Prediabetes  - Hemoglobin A1c  4. 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 Age related osteoporosis, unspecified pathological fracture presence On bisphosphonate holiday. Check BMD Jan 2027  6. History of adenomatous polyp of colon  - Ambulatory referral to Gastroenterology  7. Bursitis of left shoulder Slowly but steadily  improving. She is to call for orthopedic referral if it does not continue to improve.   Return in about 1 year (around 09/25/2025) for YEARLY CHECKUP, LIPIDS.     Nancyann Perry, MD  [facility] [phone] (phone) [phone] (fax)  [facility]

## 2024-09-25 NOTE — Patient Instructions (Addendum)
 Please review the attached list of medications and notify my office if there are any errors.   I strongly recommend two doses of Shingrix  (the shingles vaccine) separated by 2 to 6 months for adults age 74 years and older. I recommend checking with your insurance plan regarding coverage for this vaccine.   Please call the [facility] [phone]) to schedule a routine screening mammogram.

## 2024-09-26 DIAGNOSIS — E7800 Pure hypercholesterolemia, unspecified: Secondary | ICD-10-CM | POA: Diagnosis not present

## 2024-09-26 DIAGNOSIS — E559 Vitamin D deficiency, unspecified: Secondary | ICD-10-CM | POA: Diagnosis not present

## 2024-09-26 DIAGNOSIS — R7303 Prediabetes: Secondary | ICD-10-CM | POA: Diagnosis not present

## 2024-09-27 ENCOUNTER — Ambulatory Visit: Payer: Self-pay | Admitting: Family Medicine

## 2024-09-27 LAB — CBC
Hematocrit: 40.5 % (ref 34.0–46.6)
Hemoglobin: 13.2 g/dL (ref 11.1–15.9)
MCH: 32 pg (ref 26.6–33.0)
MCHC: 32.6 g/dL (ref 31.5–35.7)
MCV: 98 fL — ABNORMAL HIGH (ref 79–97)
Platelets: 289 x10E3/uL (ref 150–450)
RBC: 4.12 x10E6/uL (ref 3.77–5.28)
RDW: 11.7 % (ref 11.7–15.4)
WBC: 5.6 x10E3/uL (ref 3.4–10.8)

## 2024-09-27 LAB — COMPREHENSIVE METABOLIC PANEL WITH GFR
ALT: 15 IU/L (ref 0–32)
AST: 18 IU/L (ref 0–40)
Albumin: 4.3 g/dL (ref 3.8–4.8)
Alkaline Phosphatase: 78 IU/L (ref 49–135)
BUN/Creatinine Ratio: 18 (ref 12–28)
BUN: 12 mg/dL (ref 8–27)
Bilirubin Total: 0.5 mg/dL (ref 0.0–1.2)
CO2: 25 mmol/L (ref 20–29)
Calcium: 11 mg/dL — ABNORMAL HIGH (ref 8.7–10.3)
Chloride: 103 mmol/L (ref 96–106)
Creatinine, Ser: 0.68 mg/dL (ref 0.57–1.00)
Globulin, Total: 2.4 g/dL (ref 1.5–4.5)
Glucose: 97 mg/dL (ref 70–99)
Potassium: 5.1 mmol/L (ref 3.5–5.2)
Sodium: 139 mmol/L (ref 134–144)
Total Protein: 6.7 g/dL (ref 6.0–8.5)
eGFR: 91 mL/min/1.73 (ref >59)

## 2024-09-27 LAB — HEMOGLOBIN A1C
Est. average glucose Bld gHb Est-mCnc: 117 mg/dL
Hgb A1c MFr Bld: 5.7 % — ABNORMAL HIGH (ref 4.8–5.6)

## 2024-09-27 LAB — LIPID PANEL
Chol/HDL Ratio: 3.6 ratio (ref 0.0–4.4)
Cholesterol, Total: 210 mg/dL — ABNORMAL HIGH (ref 100–199)
HDL: 59 mg/dL (ref >39)
LDL Chol Calc (NIH): 125 mg/dL — ABNORMAL HIGH (ref 0–99)
Triglycerides: 148 mg/dL (ref 0–149)
VLDL Cholesterol Cal: 26 mg/dL (ref 5–40)

## 2024-09-27 LAB — VITAMIN D 25 HYDROXY (VIT D DEFICIENCY, FRACTURES): Vit D, 25-Hydroxy: 37 ng/mL (ref 30.0–100.0)

## 2024-09-29 ENCOUNTER — Telehealth: Payer: Self-pay

## 2024-10-01 NOTE — Telephone Encounter (Signed)
 I do not  know what this message is in regards to.

## 2024-10-26 ENCOUNTER — Other Ambulatory Visit: Payer: Self-pay | Admitting: Family Medicine

## 2024-10-26 DIAGNOSIS — R002 Palpitations: Secondary | ICD-10-CM

## 2024-11-13 ENCOUNTER — Ambulatory Visit
Admission: RE | Admit: 2024-11-13 | Discharge: 2024-11-13 | Disposition: A | Discharge status: Home / Self Care | Source: Ambulatory Visit | Attending: Family Medicine | Admitting: Family Medicine

## 2024-11-13 DIAGNOSIS — Z1231 Encounter for screening mammogram for malignant neoplasm of breast: Secondary | ICD-10-CM | POA: Diagnosis present

## 2025-09-04 ENCOUNTER — Ambulatory Visit
